# Patient Record
Sex: Female | Born: 1975 | Race: White | Hispanic: No | Marital: Married | Smoking: Former smoker
Health system: Southern US, Community
[De-identification: ages and names within clinical notes are randomized; demographics above are authoritative.]

## PROBLEM LIST (undated history)

## (undated) DIAGNOSIS — N200 Calculus of kidney: Secondary | ICD-10-CM

## (undated) HISTORY — PX: TUBAL LIGATION: SHX77

## (undated) HISTORY — PX: LITHOTRIPSY: SUR834

---

## 1999-06-23 ENCOUNTER — Other Ambulatory Visit: Admission: RE | Admit: 1999-06-23 | Discharge: 1999-06-23 | Payer: Self-pay | Admitting: *Deleted

## 2001-06-04 ENCOUNTER — Other Ambulatory Visit: Admission: RE | Admit: 2001-06-04 | Discharge: 2001-06-04 | Payer: Self-pay | Admitting: Gynecology

## 2002-01-03 ENCOUNTER — Inpatient Hospital Stay (HOSPITAL_COMMUNITY): Admission: AD | Admit: 2002-01-03 | Discharge: 2002-01-05 | Payer: Self-pay | Admitting: Gynecology

## 2002-01-03 ENCOUNTER — Encounter (INDEPENDENT_AMBULATORY_CARE_PROVIDER_SITE_OTHER): Payer: Self-pay | Admitting: *Deleted

## 2002-02-17 ENCOUNTER — Other Ambulatory Visit: Admission: RE | Admit: 2002-02-17 | Discharge: 2002-02-17 | Payer: Self-pay | Admitting: Gynecology

## 2002-08-22 ENCOUNTER — Ambulatory Visit (HOSPITAL_COMMUNITY): Admission: RE | Admit: 2002-08-22 | Discharge: 2002-08-22 | Payer: Self-pay | Admitting: Gynecology

## 2004-10-10 ENCOUNTER — Other Ambulatory Visit: Admission: RE | Admit: 2004-10-10 | Discharge: 2004-10-10 | Payer: Self-pay | Admitting: Family Medicine

## 2008-03-05 ENCOUNTER — Emergency Department (HOSPITAL_COMMUNITY): Admission: EM | Admit: 2008-03-05 | Discharge: 2008-03-05 | Payer: Self-pay | Admitting: Emergency Medicine

## 2008-12-14 ENCOUNTER — Other Ambulatory Visit: Admission: RE | Admit: 2008-12-14 | Discharge: 2008-12-14 | Payer: Self-pay | Admitting: Family Medicine

## 2009-08-29 ENCOUNTER — Emergency Department (HOSPITAL_COMMUNITY): Admission: EM | Admit: 2009-08-29 | Discharge: 2009-08-29 | Payer: Self-pay | Admitting: Emergency Medicine

## 2009-09-02 ENCOUNTER — Ambulatory Visit (HOSPITAL_COMMUNITY): Admission: RE | Admit: 2009-09-02 | Discharge: 2009-09-02 | Payer: Self-pay | Admitting: Urology

## 2010-01-13 ENCOUNTER — Ambulatory Visit: Payer: Self-pay | Admitting: Internal Medicine

## 2010-01-17 LAB — CBC WITH DIFFERENTIAL/PLATELET
BASO%: 0.4 % (ref 0.0–2.0)
Basophils Absolute: 0 10*3/uL (ref 0.0–0.1)
EOS%: 1 % (ref 0.0–7.0)
Eosinophils Absolute: 0.1 10*3/uL (ref 0.0–0.5)
LYMPH%: 25 % (ref 14.0–49.7)
MCH: 28.9 pg (ref 25.1–34.0)
MONO#: 0.4 10*3/uL (ref 0.1–0.9)
Platelets: 459 10*3/uL — ABNORMAL HIGH (ref 145–400)
lymph#: 2.4 10*3/uL (ref 0.9–3.3)

## 2010-01-17 LAB — IRON AND TIBC
%SAT: 5 % — ABNORMAL LOW (ref 20–55)
TIBC: 398 ug/dL (ref 250–470)
UIBC: 377 ug/dL

## 2010-01-17 LAB — COMPREHENSIVE METABOLIC PANEL
ALT: 9 U/L (ref 0–35)
Albumin: 4.4 g/dL (ref 3.5–5.2)
Calcium: 9.2 mg/dL (ref 8.4–10.5)
Total Protein: 7.2 g/dL (ref 6.0–8.3)

## 2010-01-17 LAB — FERRITIN: Ferritin: 10 ng/mL (ref 10–291)

## 2010-01-17 LAB — LACTATE DEHYDROGENASE: LDH: 109 U/L (ref 94–250)

## 2010-02-17 ENCOUNTER — Ambulatory Visit: Payer: Self-pay | Admitting: Internal Medicine

## 2010-04-01 ENCOUNTER — Encounter: Admission: RE | Admit: 2010-04-01 | Discharge: 2010-04-01 | Payer: Self-pay | Admitting: Internal Medicine

## 2010-10-16 ENCOUNTER — Emergency Department (HOSPITAL_COMMUNITY)
Admission: EM | Admit: 2010-10-16 | Discharge: 2010-10-16 | Payer: Self-pay | Source: Home / Self Care | Admitting: Emergency Medicine

## 2011-02-16 LAB — CBC
MCHC: 32.8 g/dL (ref 30.0–36.0)
Platelets: 524 10*3/uL — ABNORMAL HIGH (ref 150–400)
RBC: 4.67 MIL/uL (ref 3.87–5.11)
RDW: 15.3 % (ref 11.5–15.5)
WBC: 18.5 10*3/uL — ABNORMAL HIGH (ref 4.0–10.5)

## 2011-02-16 LAB — URINALYSIS, ROUTINE W REFLEX MICROSCOPIC
Bilirubin Urine: NEGATIVE
Glucose, UA: NEGATIVE mg/dL
Nitrite: NEGATIVE
Protein, ur: NEGATIVE mg/dL
Urobilinogen, UA: 0.2 mg/dL (ref 0.0–1.0)

## 2011-02-16 LAB — DIFFERENTIAL
Eosinophils Absolute: 0 10*3/uL (ref 0.0–0.7)
Eosinophils Relative: 0 % (ref 0–5)
Lymphs Abs: 1.6 10*3/uL (ref 0.7–4.0)
Monocytes Absolute: 0.5 10*3/uL (ref 0.1–1.0)

## 2011-02-16 LAB — URINE MICROSCOPIC-ADD ON

## 2011-02-16 LAB — PREGNANCY, URINE: Preg Test, Ur: NEGATIVE

## 2011-03-31 NOTE — Discharge Summary (Signed)
[facility]  Patient:    Cheryl Rivera, Cheryl Rivera Visit Number: [id number] MRN: [id number]          Service Type: OBS Location: [id number] [id number] Attending Physician:  Douglass Rivers Dictated by:   Antony Contras, [facility] Admit Date:  01/03/2002 Discharge Date: 01/05/2002                             Discharge Summary  DISCHARGE DIAGNOSES:          1. Intrauterine pregnancy at 40 weeks.                               2. Spontaneous onset of labor.  PROCEDURE:                    Normal spontaneous vaginal delivery of viable infant over intact perineum.  HISTORY OF PRESENT ILLNESS:   The patient is Rivera 35 year old, gravida 2, para 1-0-0-1, LMP Apr 08, 2001, [facility] January 02, 2002.  Prenatal course uncomplicated.  PRENATAL LABORATORY DATA:     Blood type Rivera positive, antibody screen negative. RPR, HBSAG, and HIV nonreactive, rubella immune.  MSAFP normal.  GBS was positive.  HOSPITAL COURSE:              The patient was admitted on January 03, 2002, with spontaneous onset of labor.  Cervix was 4 cm.  She progressed to complete dilatation, delivered an Apgars 8 and 20 female infant over intact perineum. Birth weight was 8 pounds 8 ounces.  She did experience some mild uterine atony immediately postpartum which was managed with massage and IM Methergine. Otherwise postpartum course was uncomplicated.  She was able to be discharged on her second postpartum day. CBC; hematocrit 35.5, hemoglobin 12, WBC 10, platelets 270.  DISPOSITION:                  Follow up in six weeks.  Continue prenatal vitamins and iron.  Motrin. Dictated by:   Antony Contras, [facility] Attending Physician:  Douglass Rivers DD:  01/20/02 TD:  01/21/02 Job: [id number]

## 2011-03-31 NOTE — Op Note (Signed)
NAME:  Cheryl Rivera, Cheryl Rivera                          ACCOUNT NO.:  [id number]   MEDICAL RECORD NO.:  [id number]                   PATIENT TYPE:  AMB   LOCATION:  SDC                                  FACILITY:  [facility]   PHYSICIAN:  Ivor Costa. Farrel Gobble, M.D.              DATE OF BIRTH:  [birth year]   DATE OF PROCEDURE:  08/22/2002  DATE OF DISCHARGE:                                 OPERATIVE REPORT   PREOPERATIVE DIAGNOSES:  Undesired fertility.   POSTOPERATIVE DIAGNOSES:  Undesired fertility.   PROCEDURE:  Scope bilateral tubal ligation with Falope rings.   SURGEON:  Ivor Costa. Farrel Gobble, M.D.   ANESTHESIA:  General.   ESTIMATED BLOOD LOSS:  None.   INDICATIONS:  This patient is Rivera 35 year old G2, P2 with undesired fertility.  Currently contracepting with birth control pills.   FINDINGS:  There were normal uterus, tubes, and ovaries.  There is Rivera left-  sided bowel adhesion which were left in place.   PATHOLOGY:  None.   COMPLICATIONS:  None.   PROCEDURE:  The patient was taken to the operating room.  General anesthesia  was induced.  Placed in the dorsal lithotomy position.  Prepped and draped  in usual sterile fashion.  Rivera bivalve speculum was placed in the vagina.  Cervix was visualized and uterine manipulator was placed.  Attention was  then turned to the abdomen.  Infraumbilical incision was made with Rivera scalpel  through which the Veress needle was inserted.  Free flow of fluid was noted.  Opening pressure was 9.  Rivera pneumoperitoneum was created until tympany was  appreciated above the liver.  Veress needle was then removed and Rivera number 10  disposable trocar was inserted through the infraumbilical port.  Placement  in the cavity was confirmed.  Findings were as above.  The bowel adhesions  made it difficult to visualize the tube on the left.  Therefore despite deep  Trendelenburg.  Therefore, Rivera suprapubic port was made under direct  visualization through which we could place the Falope  ring.  The right tube  was grasped, elevated, and Rivera Falope ring was placed.  On the left the bowel  was gently retracted from the area, although the adhesions were not cut, the  tube was able to be visualized out to the fimbriated end and in Rivera similar  fashion the Falope ring was placed.  The instruments were then removed under  direct visualization.  Both ports were injected with 0.25% Marcaine for Rivera  total of 10 cc.  Instruments were removed from the vagina and the cervix  noted to be hemostatic.  The fascia was closed with 0 Vicryl in both ports.  Skin was closed with 3-0 plain.  The patient tolerated procedure well.  Sponge, lap, and needle counts correct x2.  She was transferred to the PACU  in stable condition.  Ivor Costa. Farrel Gobble, M.D.    THL/MEDQ  D:  08/22/2002  T:  08/22/2002  Job:  [id number]

## 2011-08-08 LAB — URINALYSIS, ROUTINE W REFLEX MICROSCOPIC
Glucose, UA: NEGATIVE
Nitrite: NEGATIVE
Protein, ur: 30 — AB
Specific Gravity, Urine: 1.026
pH: 5.5

## 2011-08-08 LAB — PREGNANCY, URINE: Preg Test, Ur: NEGATIVE

## 2011-08-08 LAB — URINE MICROSCOPIC-ADD ON

## 2012-06-14 ENCOUNTER — Other Ambulatory Visit (HOSPITAL_COMMUNITY)
Admission: RE | Admit: 2012-06-14 | Discharge: 2012-06-14 | Disposition: A | Discharge status: Home / Self Care | Payer: 59 | Source: Ambulatory Visit | Attending: Family Medicine | Admitting: Family Medicine

## 2012-06-14 DIAGNOSIS — Z01419 Encounter for gynecological examination (general) (routine) without abnormal findings: Secondary | ICD-10-CM | POA: Insufficient documentation

## 2012-06-17 ENCOUNTER — Other Ambulatory Visit: Payer: Self-pay | Admitting: Family Medicine

## 2016-06-09 ENCOUNTER — Encounter (HOSPITAL_COMMUNITY): Payer: Self-pay

## 2016-06-09 ENCOUNTER — Emergency Department (HOSPITAL_COMMUNITY): Payer: BLUE CROSS/BLUE SHIELD

## 2016-06-09 ENCOUNTER — Inpatient Hospital Stay (HOSPITAL_COMMUNITY)
Admission: EM | Admit: 2016-06-09 | Discharge: 2016-06-12 | DRG: 872 | Disposition: A | Discharge status: Home / Self Care | Payer: BLUE CROSS/BLUE SHIELD | Attending: Internal Medicine | Admitting: Internal Medicine

## 2016-06-09 DIAGNOSIS — Z79899 Other long term (current) drug therapy: Secondary | ICD-10-CM

## 2016-06-09 DIAGNOSIS — N136 Pyonephrosis: Secondary | ICD-10-CM | POA: Diagnosis present

## 2016-06-09 DIAGNOSIS — N12 Tubulo-interstitial nephritis, not specified as acute or chronic: Secondary | ICD-10-CM | POA: Diagnosis present

## 2016-06-09 DIAGNOSIS — N179 Acute kidney failure, unspecified: Secondary | ICD-10-CM | POA: Diagnosis present

## 2016-06-09 DIAGNOSIS — F419 Anxiety disorder, unspecified: Secondary | ICD-10-CM | POA: Diagnosis present

## 2016-06-09 DIAGNOSIS — N200 Calculus of kidney: Secondary | ICD-10-CM

## 2016-06-09 DIAGNOSIS — Z87442 Personal history of urinary calculi: Secondary | ICD-10-CM

## 2016-06-09 DIAGNOSIS — R109 Unspecified abdominal pain: Secondary | ICD-10-CM | POA: Diagnosis not present

## 2016-06-09 DIAGNOSIS — N202 Calculus of kidney with calculus of ureter: Secondary | ICD-10-CM | POA: Diagnosis present

## 2016-06-09 DIAGNOSIS — Z87891 Personal history of nicotine dependence: Secondary | ICD-10-CM

## 2016-06-09 DIAGNOSIS — E876 Hypokalemia: Secondary | ICD-10-CM | POA: Diagnosis present

## 2016-06-09 DIAGNOSIS — R944 Abnormal results of kidney function studies: Secondary | ICD-10-CM | POA: Diagnosis present

## 2016-06-09 DIAGNOSIS — A419 Sepsis, unspecified organism: Principal | ICD-10-CM | POA: Diagnosis present

## 2016-06-09 DIAGNOSIS — N390 Urinary tract infection, site not specified: Secondary | ICD-10-CM

## 2016-06-09 DIAGNOSIS — T39395A Adverse effect of other nonsteroidal anti-inflammatory drugs [NSAID], initial encounter: Secondary | ICD-10-CM | POA: Diagnosis present

## 2016-06-09 HISTORY — DX: Calculus of kidney: N20.0

## 2016-06-09 LAB — URINALYSIS, ROUTINE W REFLEX MICROSCOPIC
BILIRUBIN URINE: NEGATIVE
Glucose, UA: NEGATIVE mg/dL
KETONES UR: NEGATIVE mg/dL
NITRITE: NEGATIVE
SPECIFIC GRAVITY, URINE: 1.026 (ref 1.005–1.030)
pH: 6 (ref 5.0–8.0)

## 2016-06-09 LAB — BASIC METABOLIC PANEL
Anion gap: 15 (ref 5–15)
BUN: 46 mg/dL — AB (ref 6–20)
CALCIUM: 8.7 mg/dL — AB (ref 8.9–10.3)
CO2: 27 mmol/L (ref 22–32)
CREATININE: 3.56 mg/dL — AB (ref 0.44–1.00)
Chloride: 92 mmol/L — ABNORMAL LOW (ref 101–111)
GFR calc non Af Amer: 15 mL/min — ABNORMAL LOW (ref >60)
GFR, EST AFRICAN AMERICAN: 17 mL/min — AB (ref >60)
Glucose, Bld: 110 mg/dL — ABNORMAL HIGH (ref 65–99)
Potassium: 2.7 mmol/L — CL (ref 3.5–5.1)
SODIUM: 134 mmol/L — AB (ref 135–145)

## 2016-06-09 LAB — CBC WITH DIFFERENTIAL/PLATELET
BASOS PCT: 0 %
Basophils Absolute: 0 10*3/uL (ref 0.0–0.1)
EOS ABS: 0 10*3/uL (ref 0.0–0.7)
EOS PCT: 0 %
HCT: 34.9 % — ABNORMAL LOW (ref 36.0–46.0)
Hemoglobin: 11.4 g/dL — ABNORMAL LOW (ref 12.0–15.0)
Lymphocytes Relative: 5 %
Lymphs Abs: 1.5 10*3/uL (ref 0.7–4.0)
MCH: 26.6 pg (ref 26.0–34.0)
MCHC: 32.7 g/dL (ref 30.0–36.0)
MCV: 81.5 fL (ref 78.0–100.0)
MONO ABS: 1.2 10*3/uL — AB (ref 0.1–1.0)
MONOS PCT: 5 %
NEUTROS PCT: 90 %
Neutro Abs: 24 10*3/uL — ABNORMAL HIGH (ref 1.7–7.7)
PLATELETS: 399 10*3/uL (ref 150–400)
RBC: 4.28 MIL/uL (ref 3.87–5.11)
RDW: 15.5 % (ref 11.5–15.5)
WBC: 26.7 10*3/uL — ABNORMAL HIGH (ref 4.0–10.5)

## 2016-06-09 LAB — URINE MICROSCOPIC-ADD ON

## 2016-06-09 LAB — I-STAT BETA HCG BLOOD, ED (MC, WL, AP ONLY)

## 2016-06-09 MED ORDER — MORPHINE SULFATE (PF) 10 MG/ML IV SOLN
10.0000 mg | Freq: Once | INTRAVENOUS | Status: DC
  Filled 2016-06-09: qty 1

## 2016-06-09 MED ORDER — SODIUM CHLORIDE 0.9 % IV BOLUS (SEPSIS)
1000.0000 mL | Freq: Once | INTRAVENOUS | Status: AC
  Administered 2016-06-10: 1000 mL via INTRAVENOUS

## 2016-06-09 MED ORDER — SODIUM CHLORIDE 0.9 % IV BOLUS (SEPSIS)
1000.0000 mL | Freq: Once | INTRAVENOUS | Status: AC
  Administered 2016-06-09: 1000 mL via INTRAVENOUS

## 2016-06-09 MED ORDER — MORPHINE SULFATE (PF) 4 MG/ML IV SOLN
4.0000 mg | INTRAVENOUS | Status: DC | PRN
  Administered 2016-06-09 – 2016-06-10 (×2): 4 mg via INTRAVENOUS
  Filled 2016-06-09 (×2): qty 1

## 2016-06-09 MED ORDER — SODIUM CHLORIDE 0.9 % IV SOLN
Freq: Once | INTRAVENOUS | Status: AC
  Administered 2016-06-09: 22:00:00 via INTRAVENOUS

## 2016-06-09 MED ORDER — DEXTROSE 5 % IV SOLN
1.0000 g | Freq: Once | INTRAVENOUS | Status: AC
  Administered 2016-06-09: 1 g via INTRAVENOUS
  Filled 2016-06-09: qty 10

## 2016-06-09 MED ORDER — ONDANSETRON HCL 4 MG/2ML IJ SOLN
4.0000 mg | Freq: Once | INTRAMUSCULAR | Status: AC
  Administered 2016-06-09: 4 mg via INTRAVENOUS
  Filled 2016-06-09: qty 2

## 2016-06-09 MED ORDER — KETOROLAC TROMETHAMINE 30 MG/ML IJ SOLN
30.0000 mg | Freq: Once | INTRAMUSCULAR | Status: AC
Start: 2016-06-09 — End: 2016-06-09
  Administered 2016-06-09: 30 mg via INTRAVENOUS
  Filled 2016-06-09: qty 1

## 2016-06-09 NOTE — ED Notes (Signed)
Unable to result POC urine due to having to much mucus in urine and the control line not showing up.

## 2016-06-09 NOTE — ED Triage Notes (Signed)
Pt has hx of 88mm kidney stone.  Pt does not know if it ever passed.  It was dx "2years ago".  Pt here with rt flank pain and fever 101 at home.  Pt denies blood in urine today but states there has been.  Burning with urination.  Pt also states generalized body aches.  Family member with hand/mouth disease.

## 2016-06-09 NOTE — ED Notes (Signed)
Back from ct.

## 2016-06-09 NOTE — ED Provider Notes (Addendum)
[facility]-EMERGENCY DEPT Provider Note   CSN: [id number] Arrival date & time: 06/09/16  1703  First Provider Contact:  1800 p.m.       History   Chief Complaint Chief Complaint  Patient presents with  . Flank Pain    HPI Cheryl Rivera is a 40 y.o. female.  She has a history of prior kidney stones. She states that in 2010 she had a lithotripsy. After that she remember seeing her urologist told her that she had a stone that she would "never passed". She does not know she has ever passed. Last 3 days she's had right flank pain. Yesterday she had fever and nausea. She is able to drink today without vomiting. Actually had no vomiting yesterday either. No shakes chills riders which continues a flank pain, dark urine, decreased urine.  HPI  Past Medical History:  Diagnosis Date  . Kidney stone     There are no active problems to display for this patient.   Past Surgical History:  Procedure Laterality Date  . LITHOTRIPSY    . TUBAL LIGATION      OB History    No data available       Home Medications    Prior to Admission medications   Medication Sig Start Date End Date Taking? Authorizing Provider  amphetamine-dextroamphetamine (ADDERALL) 30 MG tablet Take 30 mg by mouth 2 (two) times daily as needed. For focus. 05/18/16  Yes Historical Provider, MD  clonazePAM (KLONOPIN) 0.5 MG tablet Take 0.5 mg by mouth 2 (two) times daily as needed for anxiety. 05/30/16  Yes Historical Provider, MD  ibuprofen (ADVIL,MOTRIN) 200 MG tablet Take 800 mg by mouth every 6 (six) hours as needed (for pain.).   Yes Historical Provider, MD  Liniments (SALONPAS PAIN RELIEF PATCH EX) Place 1 application onto the skin daily as needed (for pain.).   Yes Historical Provider, MD  methocarbamol (ROBAXIN) 750 MG tablet Take 750 mg by mouth 3 (three) times daily as needed for spasms. 04/04/16  Yes Historical Provider, MD  naproxen sodium (ANAPROX) 220 MG tablet Take 660-880 mg by mouth 2 (two) times daily as  needed (for pain.).   Yes Historical Provider, MD    Family History History reviewed. No pertinent family history.  Social History Social History  Substance Use Topics  . Smoking status: Former Games developer  . Smokeless tobacco: Never Used  . Alcohol use No     Allergies   Review of patient's allergies indicates no known allergies.   Review of Systems Review of Systems  Constitutional: Positive for chills and fever. Negative for appetite change, diaphoresis and fatigue.  HENT: Negative for mouth sores, sore throat and trouble swallowing.   Eyes: Negative for visual disturbance.  Respiratory: Negative for cough, chest tightness, shortness of breath and wheezing.   Cardiovascular: Negative for chest pain.  Gastrointestinal: Positive for nausea. Negative for abdominal distention, abdominal pain, diarrhea and vomiting.  Endocrine: Negative for polydipsia, polyphagia and polyuria.  Genitourinary: Positive for decreased urine volume, flank pain, frequency, hematuria and urgency. Negative for dysuria.  Musculoskeletal: Negative for gait problem.  Skin: Negative for color change, pallor and rash.  Neurological: Negative for dizziness, syncope, light-headedness and headaches.  Hematological: Does not bruise/bleed easily.  Psychiatric/Behavioral: Negative for behavioral problems and confusion.     Physical Exam Updated Vital Signs BP 109/64 (BP Location: Right Arm)   Pulse 105   Temp 98 F (36.7 C) (Oral)   Resp 17   Ht  (1.651  m)   Wt 175 lb (79.4 kg)   LMP 06/05/2016   SpO2 96%   BMI 29.12 kg/m   Physical Exam  Constitutional: She is oriented to person, place, and time. She appears well-developed and well-nourished. No distress.  HENT:  Head: Normocephalic.  Eyes: Conjunctivae are normal. Pupils are equal, round, and reactive to light. No scleral icterus.  Neck: Normal range of motion. Neck supple. No thyromegaly present.  Cardiovascular: Normal rate and regular  rhythm.  Exam reveals no gallop and no friction rub.   No murmur heard. Pulmonary/Chest: Effort normal and breath sounds normal. No respiratory distress. She has no wheezes. She has no rales.  Abdominal: Soft. Bowel sounds are normal. She exhibits no distension. There is no tenderness. There is no rebound.    Musculoskeletal: Normal range of motion.  Neurological: She is alert and oriented to person, place, and time.  Skin: Skin is warm and dry. No rash noted.  Psychiatric: She has a normal mood and affect. Her behavior is normal.     ED Treatments / Results  Labs (all labs ordered are listed, but only abnormal results are displayed) Labs Reviewed  URINALYSIS, ROUTINE W REFLEX MICROSCOPIC (NOT AT [facility]) - Abnormal; Notable for the following:       Result Value   Color, Urine BROWN (*)    APPearance TURBID (*)    Hgb urine dipstick LARGE (*)    Protein, ur >300 (*)    Leukocytes, UA LARGE (*)    All other components within normal limits  CBC WITH DIFFERENTIAL/PLATELET - Abnormal; Notable for the following:    WBC 26.7 (*)    Hemoglobin 11.4 (*)    HCT 34.9 (*)    Neutro Abs 24.0 (*)    Monocytes Absolute 1.2 (*)    All other components within normal limits  BASIC METABOLIC PANEL - Abnormal; Notable for the following:    Sodium 134 (*)    Potassium 2.7 (*)    Chloride 92 (*)    Glucose, Bld 110 (*)    BUN 46 (*)    Creatinine, Ser 3.56 (*)    Calcium 8.7 (*)    GFR calc non Af Amer 15 (*)    GFR calc Af Amer 17 (*)    All other components within normal limits  URINE MICROSCOPIC-ADD ON - Abnormal; Notable for the following:    Squamous Epithelial / LPF 0-5 (*)    Bacteria, UA FEW (*)    All other components within normal limits  POC URINE PREG, ED  I-STAT BETA HCG BLOOD, ED ([facility], [facility], [facility] ONLY)    EKG  EKG Interpretation None       Radiology Ct Renal Stone Study  Result Date: 06/09/2016 CLINICAL DATA:  Pt has hx of 70mm kidney stone. Pt does not know if it ever  passed. It was dx "2years ago". Pt here with rt flank pain and fever 101 at home. Pt denies blood in urine today but states there has been. Burning with urination. Pt also states generalized body aches. EXAM: CT ABDOMEN AND PELVIS WITHOUT CONTRAST TECHNIQUE: Multidetector CT imaging of the abdomen and pelvis was performed following the standard protocol without IV contrast. COMPARISON:  04/01/2010 FINDINGS: Lung bases: Minor dependent subsegmental atelectasis. Otherwise clear. Heart normal size. Hepatobiliary: Fatty infiltration of the liver. No liver mass or focal lesion. Normal gallbladder. No bile duct dilation. Spleen, pancreas, adrenal glands:  Normal. Kidneys, ureters, bladder: There is moderate right hydronephrosis with swelling  of the right kidney caused by a 1 cm proximal ureteral stone. There is relative hypoattenuation along the posterior aspect of the mid to upper pole the right kidney. There are no other right ureteral stones. There are bilateral nonobstructing intrarenal stones. No discrete renal masses. No left hydronephrosis. Normal left ureter. Bladder is unremarkable. Uterus and adnexa are unremarkable. Lymph nodes:  No adenopathy. Gastrointestinal:  Normal.  Normal appendix visualized. Musculoskeletal: Mild disc degenerative change at L1-L2. No osteoblastic or osteolytic lesions. IMPRESSION: 1. 1 cm stone in the proximal right ureter causes moderate right hydronephrosis. In addition, there is relative low attenuation along the posterior aspect of the mid to upper pole right kidney. This raises the possibility of pyelonephritis. 2. No other acute findings. 3. Bilateral nonobstructing intrarenal stones. 4. Hepatic steatosis. Electronically Signed   By: Amie Portland M.D.   On: 06/09/2016 22:40   Procedures Procedures (including critical care time)  Medications Ordered in ED Medications  morphine 4 MG/ML injection 4 mg (4 mg Intravenous Given 06/09/16 2206)  sodium chloride 0.9 % bolus 1,000  mL (not administered)  ondansetron (ZOFRAN) injection 4 mg (4 mg Intravenous Given 06/09/16 2155)  cefTRIAXone (ROCEPHIN) 1 g in dextrose 5 % 50 mL IVPB (0 g Intravenous Stopped 06/09/16 2255)  sodium chloride 0.9 % bolus 1,000 mL (1,000 mLs Intravenous New Bag/Given 06/09/16 2154)  0.9 %  sodium chloride infusion ( Intravenous New Bag/Given 06/09/16 2154)  ketorolac (TORADOL) 30 MG/ML injection 30 mg (30 mg Intravenous Given 06/09/16 2156)     Initial Impression / Assessment and Plan / ED Course  I have reviewed the triage vital signs and the nursing notes.  Pertinent labs & imaging results that were available during my care of the patient were reviewed by me and considered in my medical decision making (see chart for details).  Clinical Course  Value Comment By Time  CT RENAL STONE STUDY (Reviewed) Rolland Porter, MD 07/28 2331  CT RENAL STONE STUDY (Reviewed) Rolland Porter, MD 07/28 2331    Patient given IV pain medication. This was given one dose of IV Toradol by myself. After her kidney function is noted there was careful to avoid additional nephrotoxic drugs and was given aggressive IV fluids. In review of the patient she states that she has been taking ibuprofen that before and sometimes as often as every 4 hours for her pain and has been doing so for "quite some time".  Her CT scan shows a large right ureteral stone and findings consistent with probable pyelonephritis. Urinalysis shows blood and infection.  Final Clinical Impressions(s) / ED Diagnoses   Final diagnoses:  Nephrolithiasis  UTI (lower urinary tract infection)  Pyelonephritis   Patient's symptoms are well-controlled. I placed a call to hospitalist regarding admission as well as urologist for consultation. She's been given IV Rocephin and culture is pending.   New Prescriptions New Prescriptions   No medications on file     Rolland Porter, MD 06/09/16 1610    Rolland Porter, MD 06/10/16 [phone]

## 2016-06-09 NOTE — ED Notes (Signed)
Lab informed this RN of critical potassium 2.7, RN and provider notified.

## 2016-06-10 ENCOUNTER — Encounter (HOSPITAL_COMMUNITY): Payer: Self-pay | Admitting: Registered Nurse

## 2016-06-10 ENCOUNTER — Inpatient Hospital Stay (HOSPITAL_COMMUNITY): Payer: BLUE CROSS/BLUE SHIELD | Admitting: Anesthesiology

## 2016-06-10 ENCOUNTER — Encounter (HOSPITAL_COMMUNITY)
Admission: EM | Disposition: A | Discharge status: Home / Self Care | Payer: Self-pay | Source: Home / Self Care | Attending: Internal Medicine

## 2016-06-10 DIAGNOSIS — N12 Tubulo-interstitial nephritis, not specified as acute or chronic: Secondary | ICD-10-CM | POA: Diagnosis not present

## 2016-06-10 DIAGNOSIS — N179 Acute kidney failure, unspecified: Secondary | ICD-10-CM | POA: Diagnosis present

## 2016-06-10 DIAGNOSIS — R109 Unspecified abdominal pain: Secondary | ICD-10-CM | POA: Diagnosis present

## 2016-06-10 DIAGNOSIS — F419 Anxiety disorder, unspecified: Secondary | ICD-10-CM | POA: Diagnosis present

## 2016-06-10 DIAGNOSIS — E876 Hypokalemia: Secondary | ICD-10-CM | POA: Diagnosis present

## 2016-06-10 DIAGNOSIS — Z79899 Other long term (current) drug therapy: Secondary | ICD-10-CM | POA: Diagnosis not present

## 2016-06-10 DIAGNOSIS — F411 Generalized anxiety disorder: Secondary | ICD-10-CM | POA: Diagnosis not present

## 2016-06-10 DIAGNOSIS — Z87442 Personal history of urinary calculi: Secondary | ICD-10-CM | POA: Diagnosis not present

## 2016-06-10 DIAGNOSIS — N202 Calculus of kidney with calculus of ureter: Secondary | ICD-10-CM | POA: Diagnosis present

## 2016-06-10 DIAGNOSIS — A419 Sepsis, unspecified organism: Secondary | ICD-10-CM | POA: Diagnosis present

## 2016-06-10 DIAGNOSIS — R944 Abnormal results of kidney function studies: Secondary | ICD-10-CM | POA: Diagnosis present

## 2016-06-10 DIAGNOSIS — T39395A Adverse effect of other nonsteroidal anti-inflammatory drugs [NSAID], initial encounter: Secondary | ICD-10-CM | POA: Diagnosis present

## 2016-06-10 DIAGNOSIS — N136 Pyonephrosis: Secondary | ICD-10-CM | POA: Diagnosis present

## 2016-06-10 DIAGNOSIS — Z87891 Personal history of nicotine dependence: Secondary | ICD-10-CM | POA: Diagnosis not present

## 2016-06-10 DIAGNOSIS — N200 Calculus of kidney: Secondary | ICD-10-CM | POA: Diagnosis not present

## 2016-06-10 HISTORY — PX: CYSTOSCOPY WITH RETROGRADE PYELOGRAM, URETEROSCOPY AND STENT PLACEMENT: SHX5789

## 2016-06-10 LAB — BASIC METABOLIC PANEL
ANION GAP: 8 (ref 5–15)
Anion gap: 10 (ref 5–15)
BUN: 48 mg/dL — ABNORMAL HIGH (ref 6–20)
BUN: 45 mg/dL — ABNORMAL HIGH (ref 6–20)
CALCIUM: 7.4 mg/dL — AB (ref 8.9–10.3)
CALCIUM: 7.5 mg/dL — AB (ref 8.9–10.3)
CHLORIDE: 105 mmol/L (ref 101–111)
CO2: 25 mmol/L (ref 22–32)
CO2: 24 mmol/L (ref 22–32)
CREATININE: 3.81 mg/dL — AB (ref 0.44–1.00)
Chloride: 100 mmol/L — ABNORMAL LOW (ref 101–111)
Creatinine, Ser: 2.95 mg/dL — ABNORMAL HIGH (ref 0.44–1.00)
GFR calc Af Amer: 16 mL/min — ABNORMAL LOW (ref >60)
GFR calc Af Amer: 22 mL/min — ABNORMAL LOW (ref >60)
GFR calc non Af Amer: 14 mL/min — ABNORMAL LOW (ref >60)
GFR calc non Af Amer: 19 mL/min — ABNORMAL LOW (ref >60)
GLUCOSE: 106 mg/dL — AB (ref 65–99)
GLUCOSE: 100 mg/dL — AB (ref 65–99)
Potassium: 2.8 mmol/L — ABNORMAL LOW (ref 3.5–5.1)
Potassium: 3 mmol/L — ABNORMAL LOW (ref 3.5–5.1)
Sodium: 135 mmol/L (ref 135–145)
Sodium: 137 mmol/L (ref 135–145)

## 2016-06-10 LAB — LACTIC ACID, PLASMA: LACTIC ACID, VENOUS: 0.7 mmol/L (ref 0.5–1.9)

## 2016-06-10 LAB — CBC
HCT: 27.3 % — ABNORMAL LOW (ref 36.0–46.0)
HEMOGLOBIN: 9 g/dL — AB (ref 12.0–15.0)
MCH: 26.9 pg (ref 26.0–34.0)
MCHC: 33 g/dL (ref 30.0–36.0)
MCV: 81.5 fL (ref 78.0–100.0)
PLATELETS: 302 10*3/uL (ref 150–400)
RBC: 3.35 MIL/uL — ABNORMAL LOW (ref 3.87–5.11)
RDW: 15.8 % — ABNORMAL HIGH (ref 11.5–15.5)
WBC: 16.2 10*3/uL — ABNORMAL HIGH (ref 4.0–10.5)

## 2016-06-10 LAB — SURGICAL PCR SCREEN
MRSA, PCR: NEGATIVE
STAPHYLOCOCCUS AUREUS: NEGATIVE

## 2016-06-10 LAB — SODIUM, URINE, RANDOM: Sodium, Ur: 92 mmol/L

## 2016-06-10 LAB — CREATININE, URINE, RANDOM: CREATININE, URINE: 55.39 mg/dL

## 2016-06-10 LAB — MAGNESIUM: Magnesium: 1.8 mg/dL (ref 1.7–2.4)

## 2016-06-10 SURGERY — CYSTOURETEROSCOPY, WITH RETROGRADE PYELOGRAM AND STENT INSERTION
Anesthesia: General | Site: Ureter | Laterality: Right

## 2016-06-10 MED ORDER — FENTANYL CITRATE (PF) 250 MCG/5ML IJ SOLN
INTRAMUSCULAR | Status: AC
  Filled 2016-06-10: qty 5

## 2016-06-10 MED ORDER — DEXAMETHASONE SODIUM PHOSPHATE 10 MG/ML IJ SOLN
INTRAMUSCULAR | Status: AC
Start: 2016-06-10 — End: 2016-06-10
  Filled 2016-06-10: qty 1

## 2016-06-10 MED ORDER — LACTATED RINGERS IV SOLN
INTRAVENOUS | Status: DC | PRN
  Administered 2016-06-10: 10:00:00 via INTRAVENOUS

## 2016-06-10 MED ORDER — PROPOFOL 10 MG/ML IV BOLUS
INTRAVENOUS | Status: AC
  Filled 2016-06-10: qty 20

## 2016-06-10 MED ORDER — ONDANSETRON HCL 4 MG/2ML IJ SOLN
INTRAMUSCULAR | Status: DC | PRN
  Administered 2016-06-10: 4 mg via INTRAVENOUS

## 2016-06-10 MED ORDER — LACTATED RINGERS IV SOLN: INTRAVENOUS | Status: DC

## 2016-06-10 MED ORDER — 0.9 % SODIUM CHLORIDE (POUR BTL) OPTIME
TOPICAL | Status: DC | PRN
  Administered 2016-06-10: 1000 mL

## 2016-06-10 MED ORDER — ONDANSETRON HCL 4 MG PO TABS: 4.0000 mg | ORAL_TABLET | Freq: Four times a day (QID) | ORAL | Status: DC | PRN

## 2016-06-10 MED ORDER — LIDOCAINE 2% (20 MG/ML) 5 ML SYRINGE
INTRAMUSCULAR | Status: DC | PRN
  Administered 2016-06-10: 100 mg via INTRAVENOUS

## 2016-06-10 MED ORDER — POTASSIUM CHLORIDE 10 MEQ/50ML IV SOLN: 10.0000 meq | Freq: Once | INTRAVENOUS | Status: DC

## 2016-06-10 MED ORDER — POTASSIUM CHLORIDE 20 MEQ PO PACK
40.0000 meq | PACK | Freq: Once | ORAL | Status: DC
  Filled 2016-06-10: qty 2

## 2016-06-10 MED ORDER — DEXTROSE 5 % IV SOLN
INTRAVENOUS | Status: AC
  Filled 2016-06-10: qty 2

## 2016-06-10 MED ORDER — HYDROMORPHONE HCL 1 MG/ML IJ SOLN
1.0000 mg | INTRAMUSCULAR | Status: AC | PRN
  Administered 2016-06-10 – 2016-06-11 (×3): 1 mg via INTRAVENOUS
  Filled 2016-06-10 (×3): qty 1

## 2016-06-10 MED ORDER — ACETAMINOPHEN 325 MG PO TABS
650.0000 mg | ORAL_TABLET | ORAL | Status: DC | PRN
  Administered 2016-06-10: 650 mg via ORAL
  Filled 2016-06-10: qty 2

## 2016-06-10 MED ORDER — PHENYLEPHRINE HCL 10 MG/ML IJ SOLN
INTRAMUSCULAR | Status: DC | PRN
  Administered 2016-06-10 (×3): 80 ug via INTRAVENOUS
  Administered 2016-06-10: 40 ug via INTRAVENOUS

## 2016-06-10 MED ORDER — LIDOCAINE HCL (CARDIAC) 20 MG/ML IV SOLN
INTRAVENOUS | Status: AC
  Filled 2016-06-10: qty 5

## 2016-06-10 MED ORDER — ROCURONIUM BROMIDE 100 MG/10ML IV SOLN
INTRAVENOUS | Status: AC
  Filled 2016-06-10: qty 1

## 2016-06-10 MED ORDER — PROPOFOL 10 MG/ML IV BOLUS
INTRAVENOUS | Status: DC | PRN
  Administered 2016-06-10: 180 mg via INTRAVENOUS

## 2016-06-10 MED ORDER — ONDANSETRON HCL 4 MG/2ML IJ SOLN
INTRAMUSCULAR | Status: AC
  Filled 2016-06-10: qty 2

## 2016-06-10 MED ORDER — FENTANYL CITRATE (PF) 100 MCG/2ML IJ SOLN: 25.0000 ug | INTRAMUSCULAR | Status: DC | PRN

## 2016-06-10 MED ORDER — FAMOTIDINE IN NACL 20-0.9 MG/50ML-% IV SOLN
20.0000 mg | Freq: Two times a day (BID) | INTRAVENOUS | Status: DC
  Administered 2016-06-10: 20 mg via INTRAVENOUS
  Filled 2016-06-10: qty 50

## 2016-06-10 MED ORDER — SODIUM CHLORIDE 0.9 % IR SOLN
Status: DC | PRN
  Administered 2016-06-10: 1000 mL
  Administered 2016-06-10: 3000 mL

## 2016-06-10 MED ORDER — SODIUM CHLORIDE 0.9 % IV BOLUS (SEPSIS): 500.0000 mL | Freq: Once | INTRAVENOUS | Status: DC

## 2016-06-10 MED ORDER — MIDAZOLAM HCL 5 MG/5ML IJ SOLN
INTRAMUSCULAR | Status: DC | PRN
  Administered 2016-06-10: 2 mg via INTRAVENOUS

## 2016-06-10 MED ORDER — FENTANYL CITRATE (PF) 100 MCG/2ML IJ SOLN
INTRAMUSCULAR | Status: DC | PRN
  Administered 2016-06-10 (×2): 50 ug via INTRAVENOUS

## 2016-06-10 MED ORDER — MORPHINE SULFATE (PF) 2 MG/ML IV SOLN
2.0000 mg | INTRAVENOUS | Status: DC | PRN
  Administered 2016-06-10 – 2016-06-11 (×7): 2 mg via INTRAVENOUS
  Filled 2016-06-10 (×8): qty 1

## 2016-06-10 MED ORDER — DEXTROSE-NACL 5-0.9 % IV SOLN
INTRAVENOUS | Status: DC
  Administered 2016-06-10 (×2): via INTRAVENOUS

## 2016-06-10 MED ORDER — SODIUM CHLORIDE 0.9 % IV SOLN
Freq: Once | INTRAVENOUS | Status: AC
  Administered 2016-06-10: 12:00:00 via INTRAVENOUS

## 2016-06-10 MED ORDER — ONDANSETRON HCL 4 MG/2ML IJ SOLN: 4.0000 mg | Freq: Four times a day (QID) | INTRAMUSCULAR | Status: DC | PRN

## 2016-06-10 MED ORDER — DEXAMETHASONE SODIUM PHOSPHATE 10 MG/ML IJ SOLN
INTRAMUSCULAR | Status: AC
  Filled 2016-06-10: qty 1

## 2016-06-10 MED ORDER — ENOXAPARIN SODIUM 30 MG/0.3ML ~~LOC~~ SOLN
30.0000 mg | SUBCUTANEOUS | Status: DC
  Administered 2016-06-11: 30 mg via SUBCUTANEOUS
  Filled 2016-06-10: qty 0.3

## 2016-06-10 MED ORDER — IOHEXOL 300 MG/ML  SOLN
INTRAMUSCULAR | Status: DC | PRN
  Administered 2016-06-10: 20 mL

## 2016-06-10 MED ORDER — CLONAZEPAM 0.5 MG PO TABS: 0.5000 mg | ORAL_TABLET | Freq: Two times a day (BID) | ORAL | Status: DC | PRN

## 2016-06-10 MED ORDER — MEPERIDINE HCL 50 MG/ML IJ SOLN: 6.2500 mg | INTRAMUSCULAR | Status: DC | PRN

## 2016-06-10 MED ORDER — PROMETHAZINE HCL 25 MG/ML IJ SOLN: 6.2500 mg | INTRAMUSCULAR | Status: DC | PRN

## 2016-06-10 MED ORDER — POTASSIUM CHLORIDE CRYS ER 20 MEQ PO TBCR
40.0000 meq | EXTENDED_RELEASE_TABLET | Freq: Once | ORAL | Status: AC
  Administered 2016-06-10: 40 meq via ORAL
  Filled 2016-06-10: qty 2

## 2016-06-10 MED ORDER — SODIUM CHLORIDE 0.9 % IV BOLUS (SEPSIS)
500.0000 mL | Freq: Once | INTRAVENOUS | Status: AC
  Administered 2016-06-10: 500 mL via INTRAVENOUS

## 2016-06-10 MED ORDER — POTASSIUM CHLORIDE 20 MEQ/15ML (10%) PO SOLN
40.0000 meq | Freq: Once | ORAL | Status: AC
  Administered 2016-06-10: 40 meq via ORAL
  Filled 2016-06-10: qty 30

## 2016-06-10 MED ORDER — POTASSIUM CHLORIDE 10 MEQ/100ML IV SOLN
10.0000 meq | Freq: Once | INTRAVENOUS | Status: DC
  Filled 2016-06-10: qty 100

## 2016-06-10 MED ORDER — SODIUM CHLORIDE 0.9% FLUSH
3.0000 mL | Freq: Two times a day (BID) | INTRAVENOUS | Status: DC
  Administered 2016-06-10 (×2): 3 mL via INTRAVENOUS

## 2016-06-10 MED ORDER — MIDAZOLAM HCL 2 MG/2ML IJ SOLN
INTRAMUSCULAR | Status: AC
  Filled 2016-06-10: qty 4

## 2016-06-10 MED ORDER — FAMOTIDINE IN NACL 20-0.9 MG/50ML-% IV SOLN
20.0000 mg | INTRAVENOUS | Status: DC
  Administered 2016-06-11: 20 mg via INTRAVENOUS
  Filled 2016-06-10: qty 50

## 2016-06-10 MED ORDER — DEXTROSE 5 % IV SOLN
1.0000 g | INTRAVENOUS | Status: DC
  Administered 2016-06-10 – 2016-06-11 (×2): 1 g via INTRAVENOUS
  Filled 2016-06-10 (×3): qty 10

## 2016-06-10 SURGICAL SUPPLY — 19 items
BAG URO CATCHER STRL LF (MISCELLANEOUS) ×3 IMPLANT
BASKET ZERO TIP NITINOL 2.4FR (BASKET) IMPLANT
CATH INTERMIT  6FR 70CM (CATHETERS) ×3 IMPLANT
CLOTH BEACON ORANGE TIMEOUT ST (SAFETY) ×3 IMPLANT
FIBER LASER FLEXIVA 365 (UROLOGICAL SUPPLIES) IMPLANT
FIBER LASER TRAC TIP (UROLOGICAL SUPPLIES) IMPLANT
GLOVE BIOGEL M STRL SZ7.5 (GLOVE) ×3 IMPLANT
GOWN STRL REUS W/TWL LRG LVL3 (GOWN DISPOSABLE) ×6 IMPLANT
GUIDEWIRE ANG ZIPWIRE 038X150 (WIRE) IMPLANT
GUIDEWIRE STR DUAL SENSOR (WIRE) ×3 IMPLANT
IV NS 1000ML (IV SOLUTION) ×2
IV NS 1000ML BAXH (IV SOLUTION) ×1 IMPLANT
MANIFOLD NEPTUNE II (INSTRUMENTS) ×3 IMPLANT
PACK CYSTO (CUSTOM PROCEDURE TRAY) ×3 IMPLANT
SHEATH ACCESS URETERAL 38CM (SHEATH) IMPLANT
STENT CONTOUR 6FRX26X.038 (STENTS) ×3 IMPLANT
STENT URET 6FRX24 CONTOUR (STENTS) IMPLANT
TUBING CONNECTING 10 (TUBING) ×2 IMPLANT
TUBING CONNECTING 10' (TUBING) ×1

## 2016-06-10 NOTE — Op Note (Addendum)
Preoperative diagnosis:  1. Right ureteral stone 2. UTI 3. ARF   Postoperative diagnosis:  1. Right ureteral stone 2. UTI 3. ARF   Procedure:  1. Cystoscopy 2. Right ureteral stent placement (6 x26 - no string)   Surgeon: Rolly Salter, Montez Hageman. M.D.  Resident: Dr. Lincoln Brigham  Anesthesia: General  Complications: None  EBL: Minimal  Specimens: None  Indication: Cheryl Rivera is a 40 y.o. patient with a right ureteral stone, fever, and acute renal failure. After reviewing the management options for treatment, he elected to proceed with the above surgical procedure(s). We have discussed the potential benefits and risks of the procedure, side effects of the proposed treatment, the likelihood of the patient achieving the goals of the procedure, and any potential problems that might occur during the procedure or recuperation. Informed consent has been obtained.  Description of procedure:  The patient was taken to the operating room and general anesthesia was induced.  The patient was placed in the dorsal lithotomy position, prepped and draped in the usual sterile fashion, and preoperative antibiotics were administered. A preoperative time-out was performed.   Cystourethroscopy was performed.  The patient's urethra was examined and was normal. The bladder was then systematically examined in its entirety. There was no evidence for any bladder tumors, stones, or other mucosal pathology.    Attention then turned to the right ureteral orifice and a ureteral catheter was used to intubate the ureteral orifice. A 0.38 sensor guidewire was then advanced up the right ureter into the renal pelvis under fluoroscopic guidance. An attempt was made to pass a 6 Fr ureteral catheter into the renal pelvis to obtain a culture but the ureteral catheter was not able to passed up by the stone easily and this attempt was abandoned.  The wire was then backloaded through the cystoscope and a ureteral stent was  advance over the wire using Seldinger technique.  The stent was positioned appropriately under fluoroscopic and cystoscopic guidance. An initial attempt to place a 6 x 24 stent appeared to be too short and so a 6 x 26 stent was placed.  The wire was then removed with an adequate stent curl noted in the renal pelvis as well as in the bladder.  The bladder was then emptied and the procedure ended.  The patient appeared to tolerate the procedure well and without complications.  The patient was able to be awakened and transferred to the recovery unit in satisfactory condition.    [name] MD

## 2016-06-10 NOTE — Progress Notes (Signed)
MD notified and bolus ordered of 500cc  Of n/s

## 2016-06-10 NOTE — Transfer of Care (Signed)
Immediate Anesthesia Transfer of Care Note  Patient: Cheryl Rivera  Procedure(s) Performed: Procedure(s): CYSTOSCOPY WITH RETROGRADE PYELOGRAM, AND RIGHT URETERAL STENT PLACEMENT (Right)  Patient Location: PACU  Anesthesia Type:General  Level of Consciousness:  sedated, patient cooperative and responds to stimulation  Airway & Oxygen Therapy:Patient Spontanous Breathing and Patient connected to face mask oxgen  Post-op Assessment:  Report given to PACU RN and Post -op Vital signs reviewed and stable  Post vital signs:  Reviewed and stable  Last Vitals:  Vitals:   06/10/16 1055 06/10/16 1056  BP: (!) 106/58 (!) 102/59  Pulse: (!) 110   Resp: 18 19  Temp: 37.4 C     Complications: No apparent anesthesia complications

## 2016-06-10 NOTE — Consult Note (Signed)
Urology Consult   Physician requesting consult: Dr. Rolland Porter  Reason for consult: right ureteral stone  History of Present Illness: Cheryl Rivera is a 40 y.o. female with history of kidney stones who presented with right flank pain tonight. She has a longstanding known right sided stoned which she was initially supposed to have ESWL for but did not due to cost. She has had pain for about three days. Endorses fever and nausea, with measured temperature of 101F yesterday. Denies any emesis. Endorses dysuria and prior hematuria. Her creatinine is 3.5 from baseline of 0.7 in 2001 and has been taking greater that recommended dosing of ibuprofen. She did have a leukocytosis of 26K in the ED with few bacteria, large LE and negative nitrite on UA. She did previously have a shock wave lithotripsy performed in 2010.  She denies a history of recurrent UTIs.  Past Medical History:  Diagnosis Date  . Kidney stone     Past Surgical History:  Procedure Laterality Date  . LITHOTRIPSY    . TUBAL LIGATION       Current Hospital Medications:  Home meds:    Medication List    ASK your doctor about these medications   amphetamine-dextroamphetamine 30 MG tablet Commonly known as:  ADDERALL Take 30 mg by mouth 2 (two) times daily as needed. For focus.   clonazePAM 0.5 MG tablet Commonly known as:  KLONOPIN Take 0.5 mg by mouth 2 (two) times daily as needed for anxiety.   ibuprofen 200 MG tablet Commonly known as:  ADVIL,MOTRIN Take 800 mg by mouth every 6 (six) hours as needed (for pain.).   methocarbamol 750 MG tablet Commonly known as:  ROBAXIN Take 750 mg by mouth 3 (three) times daily as needed for spasms.   naproxen sodium 220 MG tablet Commonly known as:  ANAPROX Take 660-880 mg by mouth 2 (two) times daily as needed (for pain.).   SALONPAS PAIN RELIEF PATCH EX Place 1 application onto the skin daily as needed (for pain.).       Scheduled Meds:  Continuous Infusions: . sodium  chloride     PRN Meds:.morphine injection  Allergies: No Known Allergies  History reviewed. No pertinent family history.  Social History:  reports that she has quit smoking. She has never used smokeless tobacco. She reports that she does not drink alcohol or use drugs.  ROS: A complete review of systems was performed.  All systems are negative except for pertinent findings as noted.  Physical Exam:  Vital signs in last 24 hours: Temp:  [98 F (36.7 C)] 98 F (36.7 C) (07/28 1706) Pulse Rate:  [105-120] 105 (07/28 2233) Resp:  [17-20] 17 (07/28 2233) BP: (109-129)/(64-83) 109/64 (07/28 2233) SpO2:  [96 %-100 %] 96 % (07/28 2233) Weight:  [79.4 kg (175 lb)] 79.4 kg (175 lb) (07/28 2011) Constitutional:  Alert and oriented, No acute distress Cardiovascular: normal peripheral perfusion Respiratory: Normal respiratory effort  GI: Abdomen is soft, tender in RLQ, nondistended, no abdominal masses GU: Right CVA tenderness Neurologic: Grossly intact, no focal deficits Psychiatric: Normal mood and affect  Laboratory Data:   Recent Labs  06/09/16 2002  WBC 26.7*  HGB 11.4*  HCT 34.9*  PLT 399     Recent Labs  06/09/16 2002  NA 134*  K 2.7*  CL 92*  GLUCOSE 110*  BUN 46*  CALCIUM 8.7*  CREATININE 3.56*     Results for orders placed or performed during the hospital encounter of 06/09/16 (from  the past 24 hour(s))  Urinalysis, Routine w reflex microscopic- may I&O cath if menses     Status: Abnormal   Collection Time: 06/09/16  5:17 PM  Result Value Ref Range   Color, Urine BROWN (A) YELLOW   APPearance TURBID (A) CLEAR   Specific Gravity, Urine 1.026 1.005 - 1.030   pH 6.0 5.0 - 8.0   Glucose, UA NEGATIVE NEGATIVE mg/dL   Hgb urine dipstick LARGE (A) NEGATIVE   Bilirubin Urine NEGATIVE NEGATIVE   Ketones, ur NEGATIVE NEGATIVE mg/dL   Protein, ur >409 (A) NEGATIVE mg/dL   Nitrite NEGATIVE NEGATIVE   Leukocytes, UA LARGE (A) NEGATIVE  Urine microscopic-add on      Status: Abnormal   Collection Time: 06/09/16  5:17 PM  Result Value Ref Range   Squamous Epithelial / LPF 0-5 (A) NONE SEEN   WBC, UA TOO NUMEROUS TO COUNT 0 - 5 WBC/hpf   RBC / HPF 6-30 0 - 5 RBC/hpf   Bacteria, UA FEW (A) NONE SEEN   Urine-Other MICROSCOPIC EXAM PERFORMED ON UNCONCENTRATED URINE   CBC with Differential     Status: Abnormal   Collection Time: 06/09/16  8:02 PM  Result Value Ref Range   WBC 26.7 (H) 4.0 - 10.5 K/uL   RBC 4.28 3.87 - 5.11 MIL/uL   Hemoglobin 11.4 (L) 12.0 - 15.0 g/dL   HCT 81.1 (L) 91.4 - 78.2 %   MCV 81.5 78.0 - 100.0 fL   MCH 26.6 26.0 - 34.0 pg   MCHC 32.7 30.0 - 36.0 g/dL   RDW 95.6 21.3 - 08.6 %   Platelets 399 150 - 400 K/uL   Neutrophils Relative % 90 %   Neutro Abs 24.0 (H) 1.7 - 7.7 K/uL   Lymphocytes Relative 5 %   Lymphs Abs 1.5 0.7 - 4.0 K/uL   Monocytes Relative 5 %   Monocytes Absolute 1.2 (H) 0.1 - 1.0 K/uL   Eosinophils Relative 0 %   Eosinophils Absolute 0.0 0.0 - 0.7 K/uL   Basophils Relative 0 %   Basophils Absolute 0.0 0.0 - 0.1 K/uL  Basic metabolic panel     Status: Abnormal   Collection Time: 06/09/16  8:02 PM  Result Value Ref Range   Sodium 134 (L) 135 - 145 mmol/L   Potassium 2.7 (LL) 3.5 - 5.1 mmol/L   Chloride 92 (L) 101 - 111 mmol/L   CO2 27 22 - 32 mmol/L   Glucose, Bld 110 (H) 65 - 99 mg/dL   BUN 46 (H) 6 - 20 mg/dL   Creatinine, Ser 5.78 (H) 0.44 - 1.00 mg/dL   Calcium 8.7 (L) 8.9 - 10.3 mg/dL   GFR calc non Af Amer 15 (L) >60 mL/min   GFR calc Af Amer 17 (L) >60 mL/min   Anion gap 15 5 - 15  I-Stat Beta hCG blood, ED ([facility], [facility], [facility] only)     Status: None   Collection Time: 06/09/16  8:06 PM  Result Value Ref Range   I-stat hCG, quantitative <5.0 <5 mIU/mL   Comment 3           No results found for this or any previous visit (from the past 240 hour(s)).  Renal Function:  Recent Labs  06/09/16 2002  CREATININE 3.56*   Estimated Creatinine Clearance: 21.7 mL/min (by C-G formula based on SCr of  3.56 mg/dL).  Radiologic Imaging: Ct Renal Stone Study  Result Date: 06/09/2016 CLINICAL DATA:  Pt has hx of 7mm kidney  stone. Pt does not know if it ever passed. It was dx "2years ago". Pt here with rt flank pain and fever 101 at home. Pt denies blood in urine today but states there has been. Burning with urination. Pt also states generalized body aches. EXAM: CT ABDOMEN AND PELVIS WITHOUT CONTRAST TECHNIQUE: Multidetector CT imaging of the abdomen and pelvis was performed following the standard protocol without IV contrast. COMPARISON:  04/01/2010 FINDINGS: Lung bases: Minor dependent subsegmental atelectasis. Otherwise clear. Heart normal size. Hepatobiliary: Fatty infiltration of the liver. No liver mass or focal lesion. Normal gallbladder. No bile duct dilation. Spleen, pancreas, adrenal glands:  Normal. Kidneys, ureters, bladder: There is moderate right hydronephrosis with swelling of the right kidney caused by a 1 cm proximal ureteral stone. There is relative hypoattenuation along the posterior aspect of the mid to upper pole the right kidney. There are no other right ureteral stones. There are bilateral nonobstructing intrarenal stones. No discrete renal masses. No left hydronephrosis. Normal left ureter. Bladder is unremarkable. Uterus and adnexa are unremarkable. Lymph nodes:  No adenopathy. Gastrointestinal:  Normal.  Normal appendix visualized. Musculoskeletal: Mild disc degenerative change at L1-L2. No osteoblastic or osteolytic lesions. IMPRESSION: 1. 1 cm stone in the proximal right ureter causes moderate right hydronephrosis. In addition, there is relative low attenuation along the posterior aspect of the mid to upper pole right kidney. This raises the possibility of pyelonephritis. 2. No other acute findings. 3. Bilateral nonobstructing intrarenal stones. 4. Hepatic steatosis. Electronically Signed   By: Amie Portland M.D.   On: 06/09/2016 22:40   I independently reviewed the above imaging  studies.  Impression/Recommendation: 40 year old female with 8mm x 1cm proximal right ureteral stone and moderate hydronephrosis in addition to likely UTI and leukocytosis. She would benefit from decompression of her right kidney. We discussed the options of medical expulsive therapy, ureteral stent placement and nephrostomy tube placement. We recommend right ureteral stent placement and she agrees with this. The risks and benefits of the procedure were discussed. She elects to proceed with right ureteral stent placement.  - Please send urine culture to evaluate for UTI - Please keep NPO - Will proceed with cystoscopy, right retrograde pyelogram, right ureteral stent placement later this AM - Suspect a large contributor to her elevated creatinine is NSAID use as she has a normal contralateral kidney  Kendell Bane A Sukhu 06/10/2016, 12:04 AM

## 2016-06-10 NOTE — Progress Notes (Signed)
Patient admitted after midnight, please see H&P.  Cultures pending, IV abx given-- for stent today.  Will get lactic acid and bolus IVF and restart at higher hourly rate-- if BP does not improve, may need SDU until stabilized.  Marlin Canary DO

## 2016-06-10 NOTE — Progress Notes (Signed)
Patient ID: Cheryl Rivera, female   DOB: 1976-07-30, 40 y.o.   MRN: [id number]   Day of Surgery Subjective: Reports improved right flank pain and nausea. Has tolerated small amount of food. Feels tired but better. BP improved this afternoon.  Objective: Vital signs in last 24 hours: Temp:  [98 F (36.7 C)-99.8 F (37.7 C)] 99.8 F (37.7 C) (07/29 1100) Pulse Rate:  [94-120] 102 (07/29 1300) Resp:  [16-25] 25 (07/29 1300) BP: (86-129)/(51-83) 117/65 (07/29 1200) SpO2:  [96 %-100 %] 99 % (07/29 1300) Weight:  [79.4 kg (175 lb)-83.8 kg (184 lb 12.8 oz)] 83.8 kg (184 lb 12.8 oz) (07/29 0256)  Intake/Output from previous day: No intake/output data recorded. Intake/Output this shift: Total I/O In: 800 [I.V.:800] Out: 400 [Urine:400]  Physical Exam:  General: Alert and oriented Abdomen: Soft, ND, Right CVA tenderness with tenderness of RUQ   Lab Results:  Recent Labs  06/09/16 2002 06/10/16 0517  HGB 11.4* 9.0*  HCT 34.9* 27.3*   CBC Latest Ref Rng & Units 06/10/2016 06/09/2016 01/17/2010  WBC 4.0 - 10.5 K/uL 16.2(H) 26.7(H) 9.4  Hemoglobin 12.0 - 15.0 g/dL 9.0(L) 11.4(L) 12.5  Hematocrit 36.0 - 46.0 % 27.3(L) 34.9(L) 37.7  Platelets 150 - 400 K/uL 302 399 459(H)     BMET  Recent Labs  06/09/16 2002 06/10/16 0517  NA 134* 135  K 2.7* 2.8*  CL 92* 100*  CO2 27 25  GLUCOSE 110* 106*  BUN 46* 48*  CREATININE 3.56* 3.81*  CALCIUM 8.7* 7.4*     Studies/Results: Ct Renal Stone Study  Result Date: 06/09/2016 CLINICAL DATA:  Pt has hx of 7mm kidney stone. Pt does not know if it ever passed. It was dx "2years ago". Pt here with rt flank pain and fever 101 at home. Pt denies blood in urine today but states there has been. Burning with urination. Pt also states generalized body aches. EXAM: CT ABDOMEN AND PELVIS WITHOUT CONTRAST TECHNIQUE: Multidetector CT imaging of the abdomen and pelvis was performed following the standard protocol without IV contrast. COMPARISON:   04/01/2010 FINDINGS: Lung bases: Minor dependent subsegmental atelectasis. Otherwise clear. Heart normal size. Hepatobiliary: Fatty infiltration of the liver. No liver mass or focal lesion. Normal gallbladder. No bile duct dilation. Spleen, pancreas, adrenal glands:  Normal. Kidneys, ureters, bladder: There is moderate right hydronephrosis with swelling of the right kidney caused by a 1 cm proximal ureteral stone. There is relative hypoattenuation along the posterior aspect of the mid to upper pole the right kidney. There are no other right ureteral stones. There are bilateral nonobstructing intrarenal stones. No discrete renal masses. No left hydronephrosis. Normal left ureter. Bladder is unremarkable. Uterus and adnexa are unremarkable. Lymph nodes:  No adenopathy. Gastrointestinal:  Normal.  Normal appendix visualized. Musculoskeletal: Mild disc degenerative change at L1-L2. No osteoblastic or osteolytic lesions. IMPRESSION: 1. 1 cm stone in the proximal right ureter causes moderate right hydronephrosis. In addition, there is relative low attenuation along the posterior aspect of the mid to upper pole right kidney. This raises the possibility of pyelonephritis. 2. No other acute findings. 3. Bilateral nonobstructing intrarenal stones. 4. Hepatic steatosis. Electronically Signed   By: Amie Portland M.D.   On: 06/09/2016 22:40   Assessment/Plan: Right ureteral stone with UTI and ARF s/p right stent with improved symptoms and improving vitals - Continue broad spectrum antibiotics pending cultures - Continue to trend creatinine -  May require nephrology consult if not improving after stent placement.   LOS:  0 days   Carolin Coy 06/10/2016, 3:14 PM

## 2016-06-10 NOTE — Progress Notes (Signed)
Patient ID: Cheryl Rivera, female   DOB: 1976-08-26, 40 y.o.   MRN: [id number]   Day of Surgery Subjective: Pt still with right flank pain.  BP low this morning.    Objective: Vital signs in last 24 hours: Temp:  [98 F (36.7 C)-99.3 F (37.4 C)] 99.3 F (37.4 C) (07/29 0827) Pulse Rate:  [95-120] 103 (07/29 0827) Resp:  [16-25] 16 (07/29 0455) BP: (86-129)/(51-83) 86/55 (07/29 0827) SpO2:  [96 %-100 %] 97 % (07/29 0827) Weight:  [79.4 kg (175 lb)-83.8 kg (184 lb 12.8 oz)] 83.8 kg (184 lb 12.8 oz) (07/29 0256)  Intake/Output from previous day: No intake/output data recorded. Intake/Output this shift: No intake/output data recorded.  Physical Exam:  General: Alert and oriented Abdomen: Soft, ND, Right CVA tenderness with tenderness of RUQ   Lab Results:  Recent Labs  06/09/16 2002 06/10/16 0517  HGB 11.4* 9.0*  HCT 34.9* 27.3*   CBC Latest Ref Rng & Units 06/10/2016 06/09/2016 01/17/2010  WBC 4.0 - 10.5 K/uL 16.2(H) 26.7(H) 9.4  Hemoglobin 12.0 - 15.0 g/dL 9.0(L) 11.4(L) 12.5  Hematocrit 36.0 - 46.0 % 27.3(L) 34.9(L) 37.7  Platelets 150 - 400 K/uL 302 399 459(H)     BMET  Recent Labs  06/09/16 2002 06/10/16 0517  NA 134* 135  K 2.7* 2.8*  CL 92* 100*  CO2 27 25  GLUCOSE 110* 106*  BUN 46* 48*  CREATININE 3.56* 3.81*  CALCIUM 8.7* 7.4*     Studies/Results: Ct Renal Stone Study  Result Date: 06/09/2016 CLINICAL DATA:  Pt has hx of 58mm kidney stone. Pt does not know if it ever passed. It was dx "2years ago". Pt here with rt flank pain and fever 101 at home. Pt denies blood in urine today but states there has been. Burning with urination. Pt also states generalized body aches. EXAM: CT ABDOMEN AND PELVIS WITHOUT CONTRAST TECHNIQUE: Multidetector CT imaging of the abdomen and pelvis was performed following the standard protocol without IV contrast. COMPARISON:  04/01/2010 FINDINGS: Lung bases: Minor dependent subsegmental atelectasis. Otherwise clear. Heart  normal size. Hepatobiliary: Fatty infiltration of the liver. No liver mass or focal lesion. Normal gallbladder. No bile duct dilation. Spleen, pancreas, adrenal glands:  Normal. Kidneys, ureters, bladder: There is moderate right hydronephrosis with swelling of the right kidney caused by a 1 cm proximal ureteral stone. There is relative hypoattenuation along the posterior aspect of the mid to upper pole the right kidney. There are no other right ureteral stones. There are bilateral nonobstructing intrarenal stones. No discrete renal masses. No left hydronephrosis. Normal left ureter. Bladder is unremarkable. Uterus and adnexa are unremarkable. Lymph nodes:  No adenopathy. Gastrointestinal:  Normal.  Normal appendix visualized. Musculoskeletal: Mild disc degenerative change at L1-L2. No osteoblastic or osteolytic lesions. IMPRESSION: 1. 1 cm stone in the proximal right ureter causes moderate right hydronephrosis. In addition, there is relative low attenuation along the posterior aspect of the mid to upper pole right kidney. This raises the possibility of pyelonephritis. 2. No other acute findings. 3. Bilateral nonobstructing intrarenal stones. 4. Hepatic steatosis. Electronically Signed   By: Amie Portland M.D.   On: 06/09/2016 22:40   Assessment/Plan: Right ureteral stone with UTI and ARF - Plan for cysto and right stent placement - Continue broad spectrum antibiotics pending cultures - Saline bolus this morning.  If patient continues to demonstrate hemodynamic instability, may require transfer to step down ICU - Cause of ARF likely multifactorial.  Would not expect this with unilateral  obstruction.  Pt with excessive NSAID use at home.  May require nephrology consult if not improving after stent placement.   LOS: 0 days   Cheryl Rivera,LES 06/10/2016, 8:32 AM

## 2016-06-10 NOTE — Anesthesia Procedure Notes (Signed)
Procedure Name: LMA Insertion Date/Time: 06/10/2016 10:19 AM Performed by: Kym Groom L Pre-anesthesia Checklist: Patient identified, Emergency Drugs available, Suction available, Patient being monitored and Timeout performed Patient Re-evaluated:Patient Re-evaluated prior to inductionOxygen Delivery Method: Circle system utilized Preoxygenation: Pre-oxygenation with 100% oxygen Intubation Type: IV induction Ventilation: Mask ventilation without difficulty LMA: LMA inserted LMA Size: 4.0 Number of attempts: 1 Tube secured with: Tape Dental Injury: Teeth and Oropharynx as per pre-operative assessment

## 2016-06-10 NOTE — H&P (Signed)
History and Physical  Cheryl Rivera ZOX:[id number] DOB: [birth year] DOA: 06/09/2016  PCP:  No primary care provider on file.   Chief Complaint:  Flank pain / fever  History of Present Illness:  - Patient is a 40 yo female with history of anxiety and prior renal stones who came with cc of right flank pain. - The pain started 2-3 days ago, radiating sometimes to pubic area, associated with dysuria and urgency/frequency and hemauria occasionally. She also had a fever. No N/V/D/C but she has not eaten anything today due to pain. No chest pain/dyspnea/cough. She had mild dizziness that has resolved.  - She had renal stones diagnosed in 2011 and was supposed to get lithotripsy but she did not have insurance so she kept postponing the procedure despite having periodic symptoms.  - She has been using NSAIDs for pain as needed up to 20-3 times daily.   Review of Systems:  CONSTITUTIONAL:     No night sweats.  No fatigue.  +fever. No chills. Eyes:                            No visual changes.  No eye pain.  No eye discharge.   ENT:                              No epistaxis.  No sinus pain.  No sore throat.   No congestion. RESPIRATORY:           No cough.  No wheeze.  No hemoptysis.  No dyspnea CARDIOVASCULAR   :  No chest pains.  No palpitations. GASTROINTESTINAL:  No abdominal pain.  No nausea. No vomiting.  No diarrhea. No vconstipation.  No hematemesis.  No hematochezia.  No melena. GENITOURINARY:      +urgency.  +frequency.  +dysuria.  +hematuria.  + obstructive symptoms.  No discharge.  No pain.  MUSCULOSKELETAL:  No musculoskeletal pain.  No joint swelling.  No arthritis. NEUROLOGICAL:        No confusion.  No weakness. No headache. No seizure. PSYCHIATRIC:             No depression. No anxiety. No suicidal ideation. SKIN:                             No rashes.  No lesions.  No wounds. ENDOCRINE:                No weight loss.  No polydipsia.  No polyuria.  No  polyphagia. HEMATOLOGIC:           No purpura.  No petechiae.  No bleeding.  ALLERGIC                 : No pruritus.  No angioedema Other:  Past Medical and Surgical History:   Past Medical History:  Diagnosis Date  . Kidney stone    Past Surgical History:  Procedure Laterality Date  . LITHOTRIPSY    . TUBAL LIGATION      Social History:   reports that she has quit smoking. She has never used smokeless tobacco. She reports that she does not drink alcohol or use drugs.    No Known Allergies  History reviewed. No pertinent family history.    Prior to Admission medications   Medication Sig Start Date End Date Taking? Authorizing Provider  amphetamine-dextroamphetamine (ADDERALL) 30 MG tablet Take 30 mg by mouth 2 (two) times daily as needed. For focus. 05/18/16  Yes Historical Provider, MD  clonazePAM (KLONOPIN) 0.5 MG tablet Take 0.5 mg by mouth 2 (two) times daily as needed for anxiety. 05/30/16  Yes Historical Provider, MD  ibuprofen (ADVIL,MOTRIN) 200 MG tablet Take 800 mg by mouth every 6 (six) hours as needed (for pain.).   Yes Historical Provider, MD  Liniments (SALONPAS PAIN RELIEF PATCH EX) Place 1 application onto the skin daily as needed (for pain.).   Yes Historical Provider, MD  methocarbamol (ROBAXIN) 750 MG tablet Take 750 mg by mouth 3 (three) times daily as needed for spasms. 04/04/16  Yes Historical Provider, MD  naproxen sodium (ANAPROX) 220 MG tablet Take 660-880 mg by mouth 2 (two) times daily as needed (for pain.).   Yes Historical Provider, MD    Physical Exam: BP 109/64 (BP Location: Right Arm)   Pulse 105   Temp 98 F (36.7 C) (Oral)   Resp 17   Ht 5\' 5"  (1.651 m)   Wt 79.4 kg (175 lb)   LMP 06/05/2016   SpO2 96%   BMI 29.12 kg/m   GENERAL :   Alert and cooperative, and appears to be in no acute distress. HEAD:           normocephalic. EYES:            PERRL, EOMI.  EARS:           hearing grossly intact. NOSE:           No nasal  discharge. THROAT:     Oral cavity and pharynx normal.   NECK:          supple, non-tender. CARDIAC:    Normal S1 and S2. No gallop. No murmurs.  Vascular:     no peripheral edema. LUNGS:       Clear to auscultation  ABDOMEN: Positive bowel sounds. Soft, nondistended, RLQ tenderness/right posterior tenderness. No guarding or rebound.      MSK:           No joint erythema or tenderness. Normal muscular development. EXT           : No significant deformity or joint abnormality. Neuro        : Alert, oriented to person, place, and time.                     Marland Kitchen SKIN:            No rash. No lesions. PSYCH:       No hallucination. Patient is not suicidal.          Labs on Admission:  Reviewed.   Radiological Exams on Admission: Ct Renal Stone Study  Result Date: 06/09/2016 CLINICAL DATA:  Pt has hx of 7mm kidney stone. Pt does not know if it ever passed. It was dx "2years ago". Pt here with rt flank pain and fever 101 at home. Pt denies blood in urine today but states there has been. Burning with urination. Pt also states generalized body aches. EXAM: CT ABDOMEN AND PELVIS WITHOUT CONTRAST TECHNIQUE: Multidetector CT imaging of the abdomen and pelvis was performed following the standard protocol without IV contrast. COMPARISON:  04/01/2010 FINDINGS: Lung bases: Minor dependent subsegmental atelectasis. Otherwise clear. Heart normal size. Hepatobiliary: Fatty infiltration of the liver. No liver mass or focal lesion. Normal gallbladder. No bile duct dilation. Spleen, pancreas, adrenal glands:  Normal.  Kidneys, ureters, bladder: There is moderate right hydronephrosis with swelling of the right kidney caused by a 1 cm proximal ureteral stone. There is relative hypoattenuation along the posterior aspect of the mid to upper pole the right kidney. There are no other right ureteral stones. There are bilateral nonobstructing intrarenal stones. No discrete renal masses. No left hydronephrosis. Normal left  ureter. Bladder is unremarkable. Uterus and adnexa are unremarkable. Lymph nodes:  No adenopathy. Gastrointestinal:  Normal.  Normal appendix visualized. Musculoskeletal: Mild disc degenerative change at L1-L2. No osteoblastic or osteolytic lesions. IMPRESSION: 1. 1 cm stone in the proximal right ureter causes moderate right hydronephrosis. In addition, there is relative low attenuation along the posterior aspect of the mid to upper pole right kidney. This raises the possibility of pyelonephritis. 2. No other acute findings. 3. Bilateral nonobstructing intrarenal stones. 4. Hepatic steatosis. Electronically Signed   By: Amie Portland M.D.   On: 06/09/2016 22:40     Assessment/Plan  Sepsis due to pyelonephritis:  Ucx and Bcx sent.  Started on Ceftriaxone Likely related to renal stones found in CT scan Continue IVF  Mod right hydronephrosis:  Due to right renal stone Urology consulted, likely will get a stent in am, will keep NPO Morphine prn pain   AKI:  Multifactorial: sepsis, NSAIDs, hydronephrosis Continue IVF Monitor Cr  Hypokalemia:  Will replace with KCL 40 meq PO  Anxiety: cont home meds.   Input & Output: NA Lines & Tubes: PIV DVT prophylaxis:  [place] enoxaparin  GI prophylaxis: Pepcid  Consultants: Urology Code Status: Full Family Communication: At bedside  Disposition Plan: Admit/ tele     Eston Esters M.D [facility]

## 2016-06-10 NOTE — Anesthesia Postprocedure Evaluation (Signed)
Anesthesia Post Note  Patient: Cheryl Rivera  Procedure(s) Performed: Procedure(s) (LRB): CYSTOSCOPY WITH RETROGRADE PYELOGRAM, AND RIGHT URETERAL STENT PLACEMENT (Right)  Patient location during evaluation: PACU Anesthesia Type: General Level of consciousness: awake and alert Pain management: pain level controlled Vital Signs Assessment: post-procedure vital signs reviewed and stable Respiratory status: spontaneous breathing, nonlabored ventilation, respiratory function stable and patient connected to nasal cannula oxygen Cardiovascular status: blood pressure returned to baseline and stable Postop Assessment: no signs of nausea or vomiting Anesthetic complications: no    Last Vitals:  Vitals:   06/10/16 1058 06/10/16 1100  BP: 105/71 (!) 107/53  Pulse:    Resp: (!) 25 19  Temp:  37.7 C    Last Pain:  Vitals:   06/10/16 0827  TempSrc: Oral  PainSc:                  Sebastian Ache

## 2016-06-10 NOTE — Progress Notes (Signed)
PHARMACY NOTE -  Ceftriaxone  Pharmacy has been requested to assist with dosing of Ceftriaxone for UTI. Dosage remains stable at 1gm IV q24h and need for further dosage adjustment appears unlikely at present.  (No adjustment necessary for renal impairment)  Will sign off at this time.  Please reconsult if a change in clinical status warrants re-evaluation of dosage.  Terrilee Files, PharmD. 06/10/16 @ 00:37

## 2016-06-10 NOTE — Anesthesia Preprocedure Evaluation (Addendum)
Anesthesia Evaluation  Patient identified by MRN, date of birth, ID band Patient awake    Reviewed: Allergy & Precautions, NPO status , Patient's Chart, lab work & pertinent test results  Airway Mallampati: III   Neck ROM: Full    Dental  (+) Teeth Intact, Dental Advisory Given   Pulmonary neg pulmonary ROS, former smoker,    breath sounds clear to auscultation       Cardiovascular negative cardio ROS   Rhythm:Regular     Neuro/Psych Anxiety negative neurological ROS  negative psych ROS   GI/Hepatic negative GI ROS, Neg liver ROS,   Endo/Other  negative endocrine ROS  Renal/GU negative Renal ROSRight sided stone  negative genitourinary   Musculoskeletal negative musculoskeletal ROS (+)   Abdominal   Peds negative pediatric ROS (+)  Hematology negative hematology ROS (+) anemia , 9/27   Anesthesia Other Findings   Reproductive/Obstetrics negative OB ROS SP BTL                            Anesthesia Physical Anesthesia Plan  ASA: II  Anesthesia Plan: General   Post-op Pain Management:    Induction: Intravenous  Airway Management Planned: LMA and Oral ETT  Additional Equipment:   Intra-op Plan:   Post-operative Plan: Extubation in OR  Informed Consent: I have reviewed the patients History and Physical, chart, labs and discussed the procedure including the risks, benefits and alternatives for the proposed anesthesia with the patient or authorized representative who has indicated his/her understanding and acceptance.     Plan Discussed with:   Anesthesia Plan Comments: (Check am Potasium, was 2.7, has been receiving oral potasium   Potasium 2.8 this am)       Anesthesia Quick Evaluation

## 2016-06-11 DIAGNOSIS — F411 Generalized anxiety disorder: Secondary | ICD-10-CM

## 2016-06-11 DIAGNOSIS — A419 Sepsis, unspecified organism: Principal | ICD-10-CM

## 2016-06-11 LAB — CBC
HEMATOCRIT: 26.8 % — AB (ref 36.0–46.0)
Hemoglobin: 8.3 g/dL — ABNORMAL LOW (ref 12.0–15.0)
MCH: 25.8 pg — AB (ref 26.0–34.0)
MCHC: 31 g/dL (ref 30.0–36.0)
MCV: 83.2 fL (ref 78.0–100.0)
Platelets: 324 10*3/uL (ref 150–400)
RBC: 3.22 MIL/uL — ABNORMAL LOW (ref 3.87–5.11)
RDW: 15.9 % — AB (ref 11.5–15.5)
WBC: 8.1 10*3/uL (ref 4.0–10.5)

## 2016-06-11 LAB — BASIC METABOLIC PANEL
Anion gap: 6 (ref 5–15)
BUN: 31 mg/dL — AB (ref 6–20)
CALCIUM: 7.6 mg/dL — AB (ref 8.9–10.3)
CO2: 23 mmol/L (ref 22–32)
CREATININE: 1.82 mg/dL — AB (ref 0.44–1.00)
Chloride: 109 mmol/L (ref 101–111)
GFR calc non Af Amer: 33 mL/min — ABNORMAL LOW (ref >60)
GFR, EST AFRICAN AMERICAN: 39 mL/min — AB (ref >60)
Glucose, Bld: 91 mg/dL (ref 65–99)
Potassium: 3.5 mmol/L (ref 3.5–5.1)
SODIUM: 138 mmol/L (ref 135–145)

## 2016-06-11 LAB — URINE CULTURE

## 2016-06-11 MED ORDER — POTASSIUM CHLORIDE CRYS ER 20 MEQ PO TBCR
40.0000 meq | EXTENDED_RELEASE_TABLET | Freq: Once | ORAL | Status: AC
  Administered 2016-06-11: 40 meq via ORAL
  Filled 2016-06-11: qty 2

## 2016-06-11 MED ORDER — HYDROCODONE-ACETAMINOPHEN 5-325 MG PO TABS
1.0000 | ORAL_TABLET | ORAL | Status: DC | PRN
  Administered 2016-06-11 – 2016-06-12 (×3): 1 via ORAL
  Filled 2016-06-11: qty 2
  Filled 2016-06-11 (×2): qty 1

## 2016-06-11 NOTE — Progress Notes (Signed)
Patient ID: Cheryl Rivera, female   DOB: [birth year], 40 y.o.   MRN: [id number]   1 Day Post-Op Subjective: Improved right abdominal pain, still has some right back pain. Vitals stable. WBC down to 8 and creatinine decreased to 1.8 with good UOP.  Objective: Vital signs in last 24 hours: Temp:  [98 F (36.7 C)-101 F (38.3 C)] 98 F (36.7 C) (07/30 0557) Pulse Rate:  [89-110] 89 (07/30 0557) Resp:  [18-29] 18 (07/30 0557) BP: (86-123)/(53-84) 114/67 (07/30 0557) SpO2:  [92 %-100 %] 99 % (07/30 0557) Weight:  [86.3 kg (190 lb 4.8 oz)] 86.3 kg (190 lb 4.8 oz) (07/29 2059)  Intake/Output from previous day: 07/29 0701 - 07/30 0700 In: 2450 [I.V.:800; IV Piggyback:600] Out: 650 [Urine:650] Intake/Output this shift: No intake/output data recorded.  Physical Exam:  General: Alert and oriented Abdomen: Soft, ND, Right CVA tenderness    Lab Results:  Recent Labs  06/09/16 2002 06/10/16 0517 06/11/16 0504  HGB 11.4* 9.0* 8.3*  HCT 34.9* 27.3* 26.8*   CBC Latest Ref Rng & Units 06/11/2016 06/10/2016 06/09/2016  WBC 4.0 - 10.5 K/uL 8.1 16.2(H) 26.7(H)  Hemoglobin 12.0 - 15.0 g/dL 8.3(L) 9.0(L) 11.4(L)  Hematocrit 36.0 - 46.0 % 26.8(L) 27.3(L) 34.9(L)  Platelets 150 - 400 K/uL 324 302 399     BMET  Recent Labs  06/10/16 1209 06/11/16 0504  NA 137 138  K 3.0* 3.5  CL 105 109  CO2 24 23  GLUCOSE 100* 91  BUN 45* 31*  CREATININE 2.95* 1.82*  CALCIUM 7.5* 7.6*     Studies/Results: Ct Renal Stone Study  Result Date: 06/09/2016 CLINICAL DATA:  Pt has hx of 7mm kidney stone. Pt does not know if it ever passed. It was dx "2years ago". Pt here with rt flank pain and fever 101 at home. Pt denies blood in urine today but states there has been. Burning with urination. Pt also states generalized body aches. EXAM: CT ABDOMEN AND PELVIS WITHOUT CONTRAST TECHNIQUE: Multidetector CT imaging of the abdomen and pelvis was performed following the standard protocol without IV contrast.  COMPARISON:  04/01/2010 FINDINGS: Lung bases: Minor dependent subsegmental atelectasis. Otherwise clear. Heart normal size. Hepatobiliary: Fatty infiltration of the liver. No liver mass or focal lesion. Normal gallbladder. No bile duct dilation. Spleen, pancreas, adrenal glands:  Normal. Kidneys, ureters, bladder: There is moderate right hydronephrosis with swelling of the right kidney caused by a 1 cm proximal ureteral stone. There is relative hypoattenuation along the posterior aspect of the mid to upper pole the right kidney. There are no other right ureteral stones. There are bilateral nonobstructing intrarenal stones. No discrete renal masses. No left hydronephrosis. Normal left ureter. Bladder is unremarkable. Uterus and adnexa are unremarkable. Lymph nodes:  No adenopathy. Gastrointestinal:  Normal.  Normal appendix visualized. Musculoskeletal: Mild disc degenerative change at L1-L2. No osteoblastic or osteolytic lesions. IMPRESSION: 1. 1 cm stone in the proximal right ureter causes moderate right hydronephrosis. In addition, there is relative low attenuation along the posterior aspect of the mid to upper pole right kidney. This raises the possibility of pyelonephritis. 2. No other acute findings. 3. Bilateral nonobstructing intrarenal stones. 4. Hepatic steatosis. Electronically Signed   By: Amie Portland M.D.   On: 06/09/2016 22:40   Assessment/Plan: Right ureteral stone with pyelonephritis and ARF s/p right stent now improving   - Continue broad spectrum antibiotics pending cultures - Continue to trend creatinine - We will arrange outpatient follow up with Dr. Annabell Howells for  discussion of treatment options for her ureteral stone   LOS: 1 day   Carolin Coy 06/11/2016, 7:55 AM

## 2016-06-11 NOTE — Progress Notes (Signed)
PROGRESS NOTE    Cheryl Rivera  WUJ:[id number] DOB: [birth year] DOA: 06/09/2016 PCP: No primary care provider on file.   Outpatient Specialists:     Brief Narrative:  History of Present Illness:  - Patient is a 39 yo female with history of anxiety and prior renal stones who came with cc of right flank pain. - The pain started 2-3 days ago, radiating sometimes to pubic area, associated with dysuria and urgency/frequency and hemauria occasionally. She also had a fever. No N/V/D/C but she has not eaten anything today due to pain. No chest pain/dyspnea/cough. She had mild dizziness that has resolved.  - She had renal stones diagnosed in 2011 and was supposed to get lithotripsy but she did not have insurance so she kept postponing the procedure despite having periodic symptoms.  - She has been using NSAIDs for pain as needed up to 20-3 times daily.    Assessment & Plan:   Active Problems:   Pyelonephritis   Sepsis due to pyelonephritis:  Ucx and Bcx sent- length of treatment per urology (10-14 days?) Started on Ceftriaxone Likely related to renal stones found in CT scan resolved  Mod right hydronephrosis:  Due to right renal stone S/p stent- urology follow up outpatient  AKI:  Improving with IVF  Hypokalemia:  replaced  Anxiety: cont home meds.    DVT prophylaxis:  lovenox  Code Status: Full Code  Family Communication: none  Disposition Plan:  Home in AM if cr improved and cultures resulted   Consultants:   urology  Procedures:        Subjective: Feeling much better  Objective: Vitals:   06/10/16 1800 06/10/16 2059 06/10/16 2311 06/11/16 0557  BP: 122/65 106/84  114/67  Pulse: (!) 103 100  89  Resp: (!) 21 20  18   Temp:  (!) 101 F (38.3 C) 98.4 F (36.9 C) 98 F (36.7 C)  TempSrc:  Oral Oral Oral  SpO2: 98% 99%  99%  Weight:  86.3 kg (190 lb 4.8 oz)    Height:  5\' 5"  (1.651 m)      Intake/Output Summary (Last 24 hours) at 06/11/16  0941 Last data filed at 06/11/16 0700  Gross per 24 hour  Intake             2450 ml  Output              650 ml  Net             1800 ml   Filed Weights   06/09/16 2011 06/10/16 0256 06/10/16 2059  Weight: 79.4 kg (175 lb) 83.8 kg (184 lb 12.8 oz) 86.3 kg (190 lb 4.8 oz)    Examination:  General exam: Appears calm and comfortable  Respiratory system: Clear to auscultation. Respiratory effort normal. Cardiovascular system: S1 & S2 heard, RRR. No JVD, murmurs, rubs, gallops or clicks. No pedal edema. Gastrointestinal system: Abdomen is nondistended, soft and nontender. No organomegaly or masses felt. Normal bowel sounds heard.     Data Reviewed: I have personally reviewed following labs and imaging studies  CBC:  Recent Labs Lab 06/09/16 2002 06/10/16 0517 06/11/16 0504  WBC 26.7* 16.2* 8.1  NEUTROABS 24.0*  --   --   HGB 11.4* 9.0* 8.3*  HCT 34.9* 27.3* 26.8*  MCV 81.5 81.5 83.2  PLT 399 302 324   Basic Metabolic Panel:  Recent Labs Lab 06/09/16 2002 06/10/16 0517 06/10/16 1209 06/11/16 0504  NA 134* 135 137 138  K  2.7* 2.8* 3.0* 3.5  CL 92* 100* 105 109  CO2 27 25 24 23   GLUCOSE 110* 106* 100* 91  BUN 46* 48* 45* 31*  CREATININE 3.56* 3.81* 2.95* 1.82*  CALCIUM 8.7* 7.4* 7.5* 7.6*  MG  --   --  1.8  --    GFR: Estimated Creatinine Clearance: 44.1 mL/min (by C-G formula based on SCr of 1.82 mg/dL). Liver Function Tests: No results for input(s): AST, ALT, ALKPHOS, BILITOT, PROT, ALBUMIN in the last 168 hours. No results for input(s): LIPASE, AMYLASE in the last 168 hours. No results for input(s): AMMONIA in the last 168 hours. Coagulation Profile: No results for input(s): INR, PROTIME in the last 168 hours. Cardiac Enzymes: No results for input(s): CKTOTAL, CKMB, CKMBINDEX, TROPONINI in the last 168 hours. BNP (last 3 results) No results for input(s): PROBNP in the last 8760 hours. HbA1C: No results for input(s): HGBA1C in the last 72  hours. CBG: No results for input(s): GLUCAP in the last 168 hours. Lipid Profile: No results for input(s): CHOL, HDL, LDLCALC, TRIG, CHOLHDL, LDLDIRECT in the last 72 hours. Thyroid Function Tests: No results for input(s): TSH, T4TOTAL, FREET4, T3FREE, THYROIDAB in the last 72 hours. Anemia Panel: No results for input(s): VITAMINB12, FOLATE, FERRITIN, TIBC, IRON, RETICCTPCT in the last 72 hours. Urine analysis:    Component Value Date/Time   COLORURINE BROWN (A) 06/09/2016 1717   APPEARANCEUR TURBID (A) 06/09/2016 1717   LABSPEC 1.026 06/09/2016 1717   PHURINE 6.0 06/09/2016 1717   GLUCOSEU NEGATIVE 06/09/2016 1717   HGBUR LARGE (A) 06/09/2016 1717   BILIRUBINUR NEGATIVE 06/09/2016 1717   KETONESUR NEGATIVE 06/09/2016 1717   PROTEINUR >300 (A) 06/09/2016 1717   UROBILINOGEN 0.2 08/29/2009 1443   NITRITE NEGATIVE 06/09/2016 1717   LEUKOCYTESUR LARGE (A) 06/09/2016 1717     ) Recent Results (from the past 240 hour(s))  Surgical pcr screen     Status: None   Collection Time: 06/10/16  7:54 AM  Result Value Ref Range Status   MRSA, PCR NEGATIVE NEGATIVE Final   Staphylococcus aureus NEGATIVE NEGATIVE Final    Comment:        The Xpert SA Assay (FDA approved for NASAL specimens in patients over 91 years of age), is one component of a comprehensive surveillance program.  Test performance has been validated by [facility] for patients greater than or equal to 1 year old. It is not intended to diagnose infection nor to guide or monitor treatment.       Anti-infectives    Start     Dose/Rate Route Frequency Ordered Stop   06/10/16 2200  cefTRIAXone (ROCEPHIN) 1 g in dextrose 5 % 50 mL IVPB     1 g 100 mL/hr over 30 Minutes Intravenous Every 24 hours 06/10/16 0036     06/09/16 2100  cefTRIAXone (ROCEPHIN) 1 g in dextrose 5 % 50 mL IVPB     1 g 100 mL/hr over 30 Minutes Intravenous  Once 06/09/16 2051 06/09/16 2255       Radiology Studies: Ct Renal Stone  Study  Result Date: 06/09/2016 CLINICAL DATA:  Pt has hx of 79mm kidney stone. Pt does not know if it ever passed. It was dx "2years ago". Pt here with rt flank pain and fever 101 at home. Pt denies blood in urine today but states there has been. Burning with urination. Pt also states generalized body aches. EXAM: CT ABDOMEN AND PELVIS WITHOUT CONTRAST TECHNIQUE: Multidetector CT imaging of the abdomen  and pelvis was performed following the standard protocol without IV contrast. COMPARISON:  04/01/2010 FINDINGS: Lung bases: Minor dependent subsegmental atelectasis. Otherwise clear. Heart normal size. Hepatobiliary: Fatty infiltration of the liver. No liver mass or focal lesion. Normal gallbladder. No bile duct dilation. Spleen, pancreas, adrenal glands:  Normal. Kidneys, ureters, bladder: There is moderate right hydronephrosis with swelling of the right kidney caused by a 1 cm proximal ureteral stone. There is relative hypoattenuation along the posterior aspect of the mid to upper pole the right kidney. There are no other right ureteral stones. There are bilateral nonobstructing intrarenal stones. No discrete renal masses. No left hydronephrosis. Normal left ureter. Bladder is unremarkable. Uterus and adnexa are unremarkable. Lymph nodes:  No adenopathy. Gastrointestinal:  Normal.  Normal appendix visualized. Musculoskeletal: Mild disc degenerative change at L1-L2. No osteoblastic or osteolytic lesions. IMPRESSION: 1. 1 cm stone in the proximal right ureter causes moderate right hydronephrosis. In addition, there is relative low attenuation along the posterior aspect of the mid to upper pole right kidney. This raises the possibility of pyelonephritis. 2. No other acute findings. 3. Bilateral nonobstructing intrarenal stones. 4. Hepatic steatosis. Electronically Signed   By: Amie Portland M.D.   On: 06/09/2016 22:40       Scheduled Meds: . cefTRIAXone (ROCEPHIN)  IV  1 g Intravenous Q24H  . enoxaparin  (LOVENOX) injection  30 mg Subcutaneous Q24H  . famotidine (PEPCID) IV  20 mg Intravenous Q24H  . potassium chloride  10 mEq Intravenous Once  . sodium chloride  500 mL Intravenous Once  . sodium chloride flush  3 mL Intravenous Q12H   Continuous Infusions:    LOS: 1 day    Time spent: 25 min    Marjory Meints Juanetta Gosling, DO [facility] Pager [phone]  If 7PM-7AM, please contact night-coverage [email or web address] Password [email or web address] 06/11/2016, 9:41 AM

## 2016-06-12 ENCOUNTER — Encounter (HOSPITAL_COMMUNITY): Payer: Self-pay | Admitting: Urology

## 2016-06-12 DIAGNOSIS — N200 Calculus of kidney: Secondary | ICD-10-CM

## 2016-06-12 LAB — CBC
HCT: 26.9 % — ABNORMAL LOW (ref 36.0–46.0)
HEMOGLOBIN: 8.6 g/dL — AB (ref 12.0–15.0)
MCH: 26.6 pg (ref 26.0–34.0)
MCHC: 32 g/dL (ref 30.0–36.0)
MCV: 83.3 fL (ref 78.0–100.0)
PLATELETS: 366 10*3/uL (ref 150–400)
RBC: 3.23 MIL/uL — AB (ref 3.87–5.11)
RDW: 15.9 % — ABNORMAL HIGH (ref 11.5–15.5)
WBC: 6.7 10*3/uL (ref 4.0–10.5)

## 2016-06-12 LAB — BASIC METABOLIC PANEL
ANION GAP: 4 — AB (ref 5–15)
BUN: 23 mg/dL — AB (ref 6–20)
CHLORIDE: 113 mmol/L — AB (ref 101–111)
CO2: 23 mmol/L (ref 22–32)
Calcium: 8.7 mg/dL — ABNORMAL LOW (ref 8.9–10.3)
Creatinine, Ser: 1.07 mg/dL — ABNORMAL HIGH (ref 0.44–1.00)
GFR calc Af Amer: >60 mL/min (ref >60)
GLUCOSE: 92 mg/dL (ref 65–99)
POTASSIUM: 4.2 mmol/L (ref 3.5–5.1)
Sodium: 140 mmol/L (ref 135–145)

## 2016-06-12 MED ORDER — CEFUROXIME AXETIL 500 MG PO TABS: 500.0000 mg | ORAL_TABLET | Freq: Two times a day (BID) | ORAL | 0 refills | Status: DC

## 2016-06-12 MED ORDER — HYDROCODONE-ACETAMINOPHEN 5-325 MG PO TABS: 1.0000 | ORAL_TABLET | ORAL | 0 refills | Status: DC | PRN

## 2016-06-12 NOTE — Progress Notes (Signed)
Patient ID: Cheryl Rivera, female   DOB: 09/20/1976, 40 y.o.   MRN: [id number]   2 Days Post-Op   Subjective: Continues to feel better. Vitals stable. WBC down to 6.7 and creatinine decreased to 1.07 with good UOP. Urine culture grew mixed species, blood culture NGTD.   Objective: Vital signs in last 24 hours: Temp:  [98.3 F (36.8 C)-99.3 F (37.4 C)] 98.3 F (36.8 C) (07/31 0534) Pulse Rate:  [74-83] 83 (07/31 0534) Resp:  [18-20] 18 (07/31 0534) BP: (117-133)/(82-86) 130/85 (07/31 0534) SpO2:  [100 %] 100 % (07/31 0534)  Intake/Output from previous day: 07/30 0701 - 07/31 0700 In: 1130 [P.O.:1080; IV Piggyback:50] Out: -  Intake/Output this shift: Total I/O In: 170 [P.O.:120; IV Piggyback:50] Out: -   Physical Exam:  General: Alert and oriented Abdomen: Soft, ND, Right CVA tenderness present but improved   Lab Results:  Recent Labs  06/10/16 0517 06/11/16 0504 06/12/16 0448  HGB 9.0* 8.3* 8.6*  HCT 27.3* 26.8* 26.9*   CBC Latest Ref Rng & Units 06/12/2016 06/11/2016 06/10/2016  WBC 4.0 - 10.5 K/uL 6.7 8.1 16.2(H)  Hemoglobin 12.0 - 15.0 g/dL 8.7(G) 8.3(L) 9.0(L)  Hematocrit 36.0 - 46.0 % 26.9(L) 26.8(L) 27.3(L)  Platelets 150 - 400 K/uL 366 324 302     BMET  Recent Labs  06/11/16 0504 06/12/16 0448  NA 138 140  K 3.5 4.2  CL 109 113*  CO2 23 23  GLUCOSE 91 92  BUN 31* 23*  CREATININE 1.82* 1.07*  CALCIUM 7.6* 8.7*     Studies/Results: No results found.  Assessment/Plan: Right ureteral stone with pyelonephritis and ARF s/p right stent on 7/29  - Ok for discharge from urologic standpoint - Recommend two week course of empiric antibiotics given lack of culture date - We will arrange outpatient follow up with Dr. Annabell Howells for discussion of treatment options for her ureteral stone   LOS: 2 days   Carolin Coy 06/12/2016, 6:57 AM  I spoke to Jalayna about the treatment options for her stone and will get her set up for right ESWL in about a week.    I have reviewed the risks of bleeding, infection, injury to the kidney or adjacent structures, failure of fragmentation with the need for secondary procedures and sedation risks.

## 2016-06-12 NOTE — Progress Notes (Signed)
Patient discharged home with family, discharge instructions given and explained to patient and she verbalized understanding, denies any pain/distress. No wound noted, skin intact. Accompanied home by husband.

## 2016-06-12 NOTE — Discharge Summary (Signed)
Physician Discharge Summary  Cheryl Rivera WGY:[id number] DOB: 11-21-1975 DOA: 06/09/2016  PCP: No primary care provider on file.  Admit date: 06/09/2016 Discharge date: 06/12/2016   Recommendations for Outpatient Follow-Up:   1. outpatient ESWL   Discharge Diagnosis:   Active Problems:   Pyelonephritis   Kidney stone   Discharge disposition:  Home  Discharge Condition: Improved.  Diet recommendation:  Regular.  Wound care: None.   History of Present Illness:   Patient is a 40 yo female with history of anxiety and prior renal stones who came with cc of right flank pain. - The pain started 2-3 days ago, radiating sometimes to pubic area, associated with dysuria and urgency/frequency and hemauria occasionally. She also had a fever. No N/V/D/C but she has not eaten anything today due to pain. No chest pain/dyspnea/cough. She had mild dizziness that has resolved.  - She had renal stones diagnosed in 2011 and was supposed to get lithotripsy but she did not have insurance so she kept postponing the procedure despite having periodic symptoms.  - She has been using NSAIDs for pain as needed up to 20-3 times daily.    Hospital Course by Problem:   Sepsis due to pyelonephritis:  Ucx and Bcx sent- 14 days Started on Ceftriaxone Likely related to renal stones found in CT scan resolved - pain meds- #15 given  Mod right hydronephrosis:  Due to right renal stone S/p stent- urology follow up outpatient for ESWL  AKI:  Improving with IVF Avoid NSAIDs  Hypokalemia:  replaced  Anxiety: cont home meds.     Medical Consultants:    urology   Discharge Exam:   Vitals:   06/11/16 2057 06/12/16 0534  BP: 133/86 130/85  Pulse: 74 83  Resp: 20 18  Temp: 99.3 F (37.4 C) 98.3 F (36.8 C)   Vitals:   06/11/16 0557 06/11/16 1437 06/11/16 2057 06/12/16 0534  BP: 114/67 117/82 133/86 130/85  Pulse: 89 76 74 83  Resp: 18 20 20 18   Temp: 98 F (36.7 C) 98.6 F  (37 C) 99.3 F (37.4 C) 98.3 F (36.8 C)  TempSrc: Oral Oral Oral Oral  SpO2: 99% 100% 100% 100%  Weight:      Height:        Gen:  NAD   The results of significant diagnostics from this hospitalization (including imaging, microbiology, ancillary and laboratory) are listed below for reference.     Procedures and Diagnostic Studies:   Ct Renal Stone Study  Result Date: 06/09/2016 CLINICAL DATA:  Pt has hx of 35mm kidney stone. Pt does not know if it ever passed. It was dx "2years ago". Pt here with rt flank pain and fever 101 at home. Pt denies blood in urine today but states there has been. Burning with urination. Pt also states generalized body aches. EXAM: CT ABDOMEN AND PELVIS WITHOUT CONTRAST TECHNIQUE: Multidetector CT imaging of the abdomen and pelvis was performed following the standard protocol without IV contrast. COMPARISON:  04/01/2010 FINDINGS: Lung bases: Minor dependent subsegmental atelectasis. Otherwise clear. Heart normal size. Hepatobiliary: Fatty infiltration of the liver. No liver mass or focal lesion. Normal gallbladder. No bile duct dilation. Spleen, pancreas, adrenal glands:  Normal. Kidneys, ureters, bladder: There is moderate right hydronephrosis with swelling of the right kidney caused by a 1 cm proximal ureteral stone. There is relative hypoattenuation along the posterior aspect of the mid to upper pole the right kidney. There are no other right ureteral stones. There are  bilateral nonobstructing intrarenal stones. No discrete renal masses. No left hydronephrosis. Normal left ureter. Bladder is unremarkable. Uterus and adnexa are unremarkable. Lymph nodes:  No adenopathy. Gastrointestinal:  Normal.  Normal appendix visualized. Musculoskeletal: Mild disc degenerative change at L1-L2. No osteoblastic or osteolytic lesions. IMPRESSION: 1. 1 cm stone in the proximal right ureter causes moderate right hydronephrosis. In addition, there is relative low attenuation along the  posterior aspect of the mid to upper pole right kidney. This raises the possibility of pyelonephritis. 2. No other acute findings. 3. Bilateral nonobstructing intrarenal stones. 4. Hepatic steatosis. Electronically Signed   By: Amie Portland M.D.   On: 06/09/2016 22:40    Labs:   Basic Metabolic Panel:  Recent Labs Lab 06/09/16 2002 06/10/16 0517 06/10/16 1209 06/11/16 0504 06/12/16 0448  NA 134* 135 137 138 140  K 2.7* 2.8* 3.0* 3.5 4.2  CL 92* 100* 105 109 113*  CO2 27 25 24 23 23   GLUCOSE 110* 106* 100* 91 92  BUN 46* 48* 45* 31* 23*  CREATININE 3.56* 3.81* 2.95* 1.82* 1.07*  CALCIUM 8.7* 7.4* 7.5* 7.6* 8.7*  MG  --   --  1.8  --   --    GFR Estimated Creatinine Clearance: 75 mL/min (by C-G formula based on SCr of 1.07 mg/dL). Liver Function Tests: No results for input(s): AST, ALT, ALKPHOS, BILITOT, PROT, ALBUMIN in the last 168 hours. No results for input(s): LIPASE, AMYLASE in the last 168 hours. No results for input(s): AMMONIA in the last 168 hours. Coagulation profile No results for input(s): INR, PROTIME in the last 168 hours.  CBC:  Recent Labs Lab 06/09/16 2002 06/10/16 0517 06/11/16 0504 06/12/16 0448  WBC 26.7* 16.2* 8.1 6.7  NEUTROABS 24.0*  --   --   --   HGB 11.4* 9.0* 8.3* 8.6*  HCT 34.9* 27.3* 26.8* 26.9*  MCV 81.5 81.5 83.2 83.3  PLT 399 302 324 366   Cardiac Enzymes: No results for input(s): CKTOTAL, CKMB, CKMBINDEX, TROPONINI in the last 168 hours. BNP: Invalid input(s): POCBNP CBG: No results for input(s): GLUCAP in the last 168 hours. D-Dimer No results for input(s): DDIMER in the last 72 hours. Hgb A1c No results for input(s): HGBA1C in the last 72 hours. Lipid Profile No results for input(s): CHOL, HDL, LDLCALC, TRIG, CHOLHDL, LDLDIRECT in the last 72 hours. Thyroid function studies No results for input(s): TSH, T4TOTAL, T3FREE, THYROIDAB in the last 72 hours.  Invalid input(s): FREET3 Anemia work up No results for input(s):  VITAMINB12, FOLATE, FERRITIN, TIBC, IRON, RETICCTPCT in the last 72 hours. Microbiology Recent Results (from the past 240 hour(s))  Culture, Urine     Status: Abnormal   Collection Time: 06/10/16 12:08 AM  Result Value Ref Range Status   Specimen Description URINE, CLEAN CATCH  Final   Special Requests NONE  Final   Culture MULTIPLE SPECIES PRESENT, SUGGEST RECOLLECTION (A)  Final   Report Status 06/11/2016 FINAL  Final  Culture, blood (routine x 2)     Status: None (Preliminary result)   Collection Time: 06/10/16  1:30 AM  Result Value Ref Range Status   Specimen Description BLOOD LEFT ANTECUBITAL  Final   Special Requests BOTTLES DRAWN AEROBIC AND ANAEROBIC  Final   Culture   Final    NO GROWTH 1 DAY Performed at [facility]    Report Status PENDING  Incomplete  Culture, blood (routine x 2)     Status: None (Preliminary result)   Collection  Time: 06/10/16  1:31 AM  Result Value Ref Range Status   Specimen Description BLOOD RIGHT ANTECUBITAL  Final   Special Requests BOTTLES DRAWN AEROBIC AND ANAEROBIC  Final   Culture   Final    NO GROWTH 1 DAY Performed at [facility]    Report Status PENDING  Incomplete  Surgical pcr screen     Status: None   Collection Time: 06/10/16  7:54 AM  Result Value Ref Range Status   MRSA, PCR NEGATIVE NEGATIVE Final   Staphylococcus aureus NEGATIVE NEGATIVE Final    Comment:        The Xpert SA Assay (FDA approved for NASAL specimens in patients over 93 years of age), is one component of a comprehensive surveillance program.  Test performance has been validated by [facility] for patients greater than or equal to 58 year old. It is not intended to diagnose infection nor to guide or monitor treatment.      Discharge Instructions:   Discharge Instructions    Diet general    Complete by:  As directed   Discharge instructions    Complete by:  As directed   Follow up with urology for ESWL   Increase activity  slowly    Complete by:  As directed       Medication List    STOP taking these medications   ibuprofen 200 MG tablet Commonly known as:  ADVIL,MOTRIN   naproxen sodium 220 MG tablet Commonly known as:  ANAPROX     TAKE these medications   amphetamine-dextroamphetamine 30 MG tablet Commonly known as:  ADDERALL Take 30 mg by mouth 2 (two) times daily as needed. For focus.   cefUROXime 500 MG tablet Commonly known as:  CEFTIN Take 1 tablet (500 mg total) by mouth 2 (two) times daily with a meal.   clonazePAM 0.5 MG tablet Commonly known as:  KLONOPIN Take 0.5 mg by mouth 2 (two) times daily as needed for anxiety.   HYDROcodone-acetaminophen 5-325 MG tablet Commonly known as:  NORCO/VICODIN Take 1-2 tablets by mouth every 4 (four) hours as needed for severe pain.   methocarbamol 750 MG tablet Commonly known as:  ROBAXIN Take 750 mg by mouth 3 (three) times daily as needed for spasms.   S[place]PAS PAIN RELIEF PATCH EX Place 1 application onto the skin daily as needed (for pain.).      Follow-up Information    Anner Crete, MD .   Specialty:  Urology Why:  for ESWL Contact information: [address] [place] [address] [phone]            Time coordinating discharge: 35 min  Signed:  Roshan Salamon U Jabreel Chimento   [facility] 06/12/2016, 12:55 PM

## 2016-06-14 ENCOUNTER — Other Ambulatory Visit: Payer: Self-pay | Admitting: Urology

## 2016-06-15 LAB — CULTURE, BLOOD (ROUTINE X 2)
CULTURE: NO GROWTH
Culture: NO GROWTH

## 2016-06-16 ENCOUNTER — Encounter (HOSPITAL_COMMUNITY): Payer: Self-pay | Admitting: *Deleted

## 2016-06-19 ENCOUNTER — Ambulatory Visit (HOSPITAL_COMMUNITY)
Admission: RE | Admit: 2016-06-19 | Discharge: 2016-06-19 | Disposition: A | Discharge status: Home / Self Care | Payer: BLUE CROSS/BLUE SHIELD | Source: Ambulatory Visit | Attending: Urology | Admitting: Urology

## 2016-06-19 ENCOUNTER — Ambulatory Visit (HOSPITAL_COMMUNITY): Payer: BLUE CROSS/BLUE SHIELD

## 2016-06-19 ENCOUNTER — Encounter (HOSPITAL_COMMUNITY): Payer: Self-pay | Admitting: *Deleted

## 2016-06-19 ENCOUNTER — Encounter (HOSPITAL_COMMUNITY)
Admission: RE | Disposition: A | Discharge status: Home / Self Care | Payer: Self-pay | Source: Ambulatory Visit | Attending: Urology

## 2016-06-19 DIAGNOSIS — N201 Calculus of ureter: Secondary | ICD-10-CM | POA: Insufficient documentation

## 2016-06-19 DIAGNOSIS — Z87891 Personal history of nicotine dependence: Secondary | ICD-10-CM | POA: Diagnosis not present

## 2016-06-19 DIAGNOSIS — N200 Calculus of kidney: Secondary | ICD-10-CM

## 2016-06-19 LAB — PREGNANCY, URINE: PREG TEST UR: NEGATIVE

## 2016-06-19 SURGERY — LITHOTRIPSY, ESWL
Anesthesia: LOCAL | Laterality: Right

## 2016-06-19 MED ORDER — CIPROFLOXACIN HCL 500 MG PO TABS
500.0000 mg | ORAL_TABLET | ORAL | Status: AC
  Administered 2016-06-19: 500 mg via ORAL
  Filled 2016-06-19: qty 1

## 2016-06-19 MED ORDER — HYDROCODONE-ACETAMINOPHEN 5-325 MG PO TABS: 1.0000 | ORAL_TABLET | ORAL | 0 refills | Status: DC | PRN

## 2016-06-19 MED ORDER — DIAZEPAM 5 MG PO TABS
10.0000 mg | ORAL_TABLET | ORAL | Status: AC
  Administered 2016-06-19: 10 mg via ORAL
  Filled 2016-06-19: qty 2

## 2016-06-19 MED ORDER — SODIUM CHLORIDE 0.9 % IV SOLN
INTRAVENOUS | Status: DC
  Administered 2016-06-19: 07:00:00 via INTRAVENOUS

## 2016-06-19 MED ORDER — DIPHENHYDRAMINE HCL 25 MG PO CAPS
25.0000 mg | ORAL_CAPSULE | ORAL | Status: AC
  Administered 2016-06-19: 25 mg via ORAL
  Filled 2016-06-19: qty 1

## 2016-06-19 NOTE — Discharge Instructions (Signed)
1 - You may have urinary urgency (bladder spasms), bloody urine on / off, and pass small stone fragments x few days. This is normal. ° °2 - Call MD or go to ER for fever >102, severe pain / nausea / vomiting not relieved by medications, or acute change in medical status °

## 2016-06-19 NOTE — Brief Op Note (Signed)
06/19/2016  9:11 AM  PATIENT:  Cheryl Rivera  40 y.o. female  PRE-OPERATIVE DIAGNOSIS:  right proximal stone  POST-OPERATIVE DIAGNOSIS:  * No post-op diagnosis entered *  PROCEDURE:  Procedure(s): RIGHT EXTRACORPOREAL SHOCK WAVE LITHOTRIPSY (ESWL) (Right)  SURGEON:  Surgeon(s) and Role:    * Sebastian Acheheodore Antonisha Waskey, MD - Primary  PHYSICIAN ASSISTANT:   ASSISTANTS: none   ANESTHESIA:   MAC  EBL:  No intake/output data recorded.  BLOOD ADMINISTERED:none  DRAINS: none   LOCAL MEDICATIONS USED:  NONE  SPECIMEN:  No Specimen  DISPOSITION OF SPECIMEN:  N/A  COUNTS:  YES  TOURNIQUET:  * No tourniquets in log *  DICTATION: .Note written in paper chart  PLAN OF CARE: Discharge to home after PACU  PATIENT DISPOSITION:  Short Stay   Delay start of Pharmacological VTE agent (>24hrs) due to surgical blood loss or risk of bleeding: yes

## 2016-06-19 NOTE — H&P (Signed)
Cheryl Rivera is an 40 y.o. female.    Chief Complaint: Pre-op RIGHT shockwave lithotripsy  HPI:   1 - RIGHT ureteral stone - 8mm stone at level of L3 by ER CT 05/2016, 1050HU. Underwent JJ stent 7/29 at time of clinical obstructed pyelonephritis. UCX at that time non-clonal and has been on cefuroxime.  Today "Cheryl Rivera" is seen to proceed with RIGHT shockwave lithotripsy. She has been on cefuroxime and w/o interval fevers.   Past Medical History:  Diagnosis Date  . Kidney stone     Past Surgical History:  Procedure Laterality Date  . CYSTOSCOPY WITH RETROGRADE PYELOGRAM, URETEROSCOPY AND STENT PLACEMENT Right 06/10/2016   Procedure: CYSTOSCOPY WITH RETROGRADE PYELOGRAM, AND RIGHT URETERAL STENT PLACEMENT;  Surgeon: Heloise PurpuraLester Borden, MD;  Location: [facility];  Service: Urology;  Laterality: Right;  . LITHOTRIPSY    . TUBAL LIGATION      History reviewed. No pertinent family history. Social History:  reports that she has quit smoking. She has never used smokeless tobacco. She reports that she does not drink alcohol or use drugs.  Allergies: No Known Allergies  No prescriptions prior to admission.    No results found for this or any previous visit (from the past 48 hour(s)). No results found.  Review of Systems  Constitutional: Negative.  Negative for chills and fever.  HENT: Negative.   Eyes: Negative.   Respiratory: Negative.   Cardiovascular: Negative.   Gastrointestinal: Negative.   Genitourinary: Positive for frequency.  Musculoskeletal: Negative.   Skin: Negative.   Neurological: Negative.   Endo/Heme/Allergies: Negative.   Psychiatric/Behavioral: Negative.     Last menstrual period 06/05/2016. Physical Exam  Constitutional: She appears well-developed.  HENT:  Head: Normocephalic.  Eyes: Pupils are equal, round, and reactive to light.  Neck: Normal range of motion.  Cardiovascular: Normal rate.   Respiratory: Effort normal.  GI: Soft.  Genitourinary:   Genitourinary Comments: No CVAT at present  Neurological: She is alert.  Skin: Skin is warm.  Psychiatric: She has a normal mood and affect. Her behavior is normal. Judgment and thought content normal.     Assessment/Plan  1 - RIGHT ureteral stone - We rediscussed shockwave lithotripsy in detail as well as my "rule of 9s" with stones <1069mm, less than 900 HU, and skin to stone distance <9cm having approximately 90% treatment success with single session of treatment. We then readdressed how stones that are larger, more dense, and in patients with less favorable anatomy have incrementally decreased success rates. We rediscussed risks including, bleeding, infection, hematoma, loss of kidney, need for staged therapy, need for adjunctive therapy and requirement to refrain from any anticoagulants, anti-platelet or aspirin-like products peri-procedureally.   After careful consideration, the patient has chosen to proceed today as planned.   Sebastian AcheMANNY, Quadir Muns, MD 06/19/2016, 6:04 AM

## 2018-03-07 ENCOUNTER — Emergency Department (HOSPITAL_BASED_OUTPATIENT_CLINIC_OR_DEPARTMENT_OTHER)
Admission: EM | Admit: 2018-03-07 | Discharge: 2018-03-07 | Disposition: A | Discharge status: Home / Self Care | Payer: BLUE CROSS/BLUE SHIELD | Attending: Emergency Medicine | Admitting: Emergency Medicine

## 2018-03-07 ENCOUNTER — Other Ambulatory Visit: Payer: Self-pay

## 2018-03-07 ENCOUNTER — Encounter (HOSPITAL_BASED_OUTPATIENT_CLINIC_OR_DEPARTMENT_OTHER): Payer: Self-pay | Admitting: *Deleted

## 2018-03-07 DIAGNOSIS — K029 Dental caries, unspecified: Secondary | ICD-10-CM | POA: Diagnosis not present

## 2018-03-07 DIAGNOSIS — M273 Alveolitis of jaws: Secondary | ICD-10-CM | POA: Diagnosis not present

## 2018-03-07 DIAGNOSIS — Z87891 Personal history of nicotine dependence: Secondary | ICD-10-CM | POA: Diagnosis not present

## 2018-03-07 DIAGNOSIS — Z79899 Other long term (current) drug therapy: Secondary | ICD-10-CM | POA: Insufficient documentation

## 2018-03-07 DIAGNOSIS — K0889 Other specified disorders of teeth and supporting structures: Secondary | ICD-10-CM | POA: Diagnosis present

## 2018-03-07 MED ORDER — AMOXICILLIN 500 MG PO CAPS: 500.0000 mg | ORAL_CAPSULE | Freq: Three times a day (TID) | ORAL | 0 refills | Status: DC

## 2018-03-07 MED ORDER — AMOXICILLIN 500 MG PO CAPS
500.0000 mg | ORAL_CAPSULE | Freq: Once | ORAL | Status: AC
  Administered 2018-03-07: 500 mg via ORAL
  Filled 2018-03-07: qty 1

## 2018-03-07 NOTE — ED Notes (Signed)
Pt verbalizes understanding of d/c instructions and denies any further needs at this time. 

## 2018-03-07 NOTE — ED Triage Notes (Signed)
Pt c/o right dental pain x 1 day, removed tooth at home and here for ABX

## 2018-03-07 NOTE — ED Provider Notes (Addendum)
[facility] Provider Note: Cheryl Dell, MD, FACEP  CSN: [id number] MRN: [id number] ARRIVAL: 03/07/18 at 0026 ROOM: [facility]12/[facility]12   CHIEF COMPLAINT  Dental Pain   HISTORY OF PRESENT ILLNESS  03/07/18 2:14 AM Cheryl Rivera is a 42 y.o. female who has a right lower second premolar that recently fractured.  She states she remove the rest of the tooth herself with a "dental instrument".  Pain was relieved at first but has returned over the last 24 hours.  She is concerned she may have a dry socket.  Pain is moderate and not adequately relieved with over-the-counter analgesics.  She is requesting antibiotics.   Past Medical History:  Diagnosis Date  . Kidney stone     Past Surgical History:  Procedure Laterality Date  . CYSTOSCOPY WITH RETROGRADE PYELOGRAM, URETEROSCOPY AND STENT PLACEMENT Right 06/10/2016   Procedure: CYSTOSCOPY WITH RETROGRADE PYELOGRAM, AND RIGHT URETERAL STENT PLACEMENT;  Surgeon: Heloise Purpura, MD;  Location: [facility];  Service: Urology;  Laterality: Right;  . LITHOTRIPSY    . TUBAL LIGATION      History reviewed. No pertinent family history.  Social History   Tobacco Use  . Smoking status: Former Games developer  . Smokeless tobacco: Never Used  Substance Use Topics  . Alcohol use: No  . Drug use: No    Prior to Admission medications   Medication Sig Start Date End Date Taking? Authorizing Provider  amphetamine-dextroamphetamine (ADDERALL) 30 MG tablet Take 30 mg by mouth 2 (two) times daily as needed. For focus. 05/18/16   [provider]  cefUROXime (CEFTIN) 500 MG tablet Take 1 tablet (500 mg total) by mouth 2 (two) times daily with a meal. 06/12/16   Joseph Art, DO  clonazePAM (KLONOPIN) 0.5 MG tablet Take 0.5 mg by mouth 2 (two) times daily as needed for anxiety. 05/30/16   [provider]  HYDROcodone-acetaminophen (NORCO/VICODIN) 5-325 MG tablet Take 1-2 tablets by mouth every 4 (four) hours as needed for severe pain. After  lithotripsy 06/19/16   Sebastian Ache, MD  [facility] PAIN RELIEF PATCH EX) Place 1 application onto the skin daily as needed (for pain.).    [provider]  methocarbamol (ROBAXIN) 750 MG tablet Take 750 mg by mouth 3 (three) times daily as needed for spasms. 04/04/16   [provider]    Allergies Patient has no known allergies.   REVIEW OF SYSTEMS  Negative except as noted here or in the History of Present Illness.   PHYSICAL EXAMINATION  Initial Vital Signs Blood pressure (!) 183/105, pulse 84, temperature 98.4 F (36.9 C), temperature source Oral, resp. rate 18, height 5\' 6"  (1.676 m), weight 90.7 kg (200 lb), last menstrual period 02/14/2018, SpO2 99 %.  Examination General: Well-developed, well-nourished female in no acute distress; appearance consistent with age of record HENT: normocephalic; atraumatic; absent right lower second premolar with empty extraction socket lacking clot, cavity seen on the adjacent first molar Eyes: pupils equal, round and reactive to light; extraocular muscles intact Neck: supple Heart: regular rate and rhythm Lungs: clear to auscultation bilaterally Abdomen: soft; nondistended; nontender; bowel sounds present Extremities: No deformity; full range of motion Neurologic: Awake, alert and oriented; motor function intact in all extremities and symmetric; no facial droop Skin: Warm and dry Psychiatric: Normal mood and affect   RESULTS  Summary of this visit's results, reviewed by myself:   EKG Interpretation  Date/Time:    Ventricular Rate:    PR Interval:  QRS Duration:   QT Interval:    QTC Calculation:   R Axis:     Text Interpretation:        Laboratory Studies: No results found for this or any previous visit (from the past 24 hour(s)). Imaging Studies: No results found.  ED COURSE and MDM  Nursing notes and initial vitals signs, including pulse oximetry, reviewed.  Vitals:   03/07/18 0035 03/07/18  0036  BP:  (!) 183/105  Pulse:  84  Resp:  18  Temp:  98.4 F (36.9 C)  TempSrc:  Oral  SpO2:  99%  Weight: 90.7 kg (200 lb)   Height: 5\' 6"  (1.676 m)    Socket filled with eugenol based dry socket paste.  Patient started on amoxicillin.  She was encouraged to see a dentist later today.  She states she has a Education officer, communitydentist but we will also provide referral to our dentist on call.  She was advised to refer to a licensed dentist for extractions in the future.  PROCEDURES    ED DIAGNOSES     ICD-10-CM   1. Dry tooth socket M27.3   2. Dental caries K02.9        Iola Turri, MD 03/07/18 [id number]    Paula LibraMolpus, Xylina Rhoads, MD 03/07/18 [id number]

## 2021-03-03 ENCOUNTER — Other Ambulatory Visit: Payer: Self-pay | Admitting: Obstetrics and Gynecology

## 2021-03-03 DIAGNOSIS — R928 Other abnormal and inconclusive findings on diagnostic imaging of breast: Secondary | ICD-10-CM

## 2021-03-28 ENCOUNTER — Ambulatory Visit
Admission: RE | Admit: 2021-03-28 | Discharge: 2021-03-28 | Disposition: A | Discharge status: Home / Self Care | Payer: BLUE CROSS/BLUE SHIELD | Source: Ambulatory Visit | Attending: Obstetrics and Gynecology | Admitting: Obstetrics and Gynecology

## 2021-03-28 ENCOUNTER — Ambulatory Visit
Admission: RE | Admit: 2021-03-28 | Discharge: 2021-03-28 | Disposition: A | Discharge status: Home / Self Care | Payer: Medicare Other | Source: Ambulatory Visit | Attending: Obstetrics and Gynecology | Admitting: Obstetrics and Gynecology

## 2021-03-28 ENCOUNTER — Other Ambulatory Visit: Payer: Self-pay

## 2021-03-28 DIAGNOSIS — R928 Other abnormal and inconclusive findings on diagnostic imaging of breast: Secondary | ICD-10-CM

## 2022-07-24 ENCOUNTER — Emergency Department (HOSPITAL_BASED_OUTPATIENT_CLINIC_OR_DEPARTMENT_OTHER): Payer: Medicare Other

## 2022-07-24 ENCOUNTER — Emergency Department (HOSPITAL_BASED_OUTPATIENT_CLINIC_OR_DEPARTMENT_OTHER)
Admission: EM | Admit: 2022-07-24 | Discharge: 2022-07-24 | Disposition: A | Discharge status: Home / Self Care | Payer: Medicare Other | Attending: Emergency Medicine | Admitting: Emergency Medicine

## 2022-07-24 ENCOUNTER — Other Ambulatory Visit: Payer: Self-pay

## 2022-07-24 ENCOUNTER — Encounter (HOSPITAL_BASED_OUTPATIENT_CLINIC_OR_DEPARTMENT_OTHER): Payer: Self-pay | Admitting: Emergency Medicine

## 2022-07-24 DIAGNOSIS — Z23 Encounter for immunization: Secondary | ICD-10-CM | POA: Insufficient documentation

## 2022-07-24 DIAGNOSIS — S61011A Laceration without foreign body of right thumb without damage to nail, initial encounter: Secondary | ICD-10-CM | POA: Insufficient documentation

## 2022-07-24 DIAGNOSIS — S65401A Unspecified injury of blood vessel of right thumb, initial encounter: Secondary | ICD-10-CM | POA: Diagnosis present

## 2022-07-24 DIAGNOSIS — Y92410 Unspecified street and highway as the place of occurrence of the external cause: Secondary | ICD-10-CM | POA: Insufficient documentation

## 2022-07-24 DIAGNOSIS — T148XXA Other injury of unspecified body region, initial encounter: Secondary | ICD-10-CM

## 2022-07-24 MED ORDER — CEPHALEXIN 500 MG PO CAPS: 500.0000 mg | ORAL_CAPSULE | Freq: Four times a day (QID) | ORAL | 0 refills | Status: AC

## 2022-07-24 MED ORDER — LIDOCAINE HCL 2 % IJ SOLN
10.0000 mL | Freq: Once | INTRAMUSCULAR | Status: AC
  Administered 2022-07-24: 200 mg via INTRADERMAL
  Filled 2022-07-24: qty 20

## 2022-07-24 MED ORDER — TETANUS-DIPHTH-ACELL PERTUSSIS 5-2.5-18.5 LF-MCG/0.5 IM SUSY
0.5000 mL | PREFILLED_SYRINGE | Freq: Once | INTRAMUSCULAR | Status: AC
  Administered 2022-07-24: 0.5 mL via INTRAMUSCULAR
  Filled 2022-07-24: qty 0.5

## 2022-07-24 MED ORDER — OXYCODONE-ACETAMINOPHEN 5-325 MG PO TABS
1.0000 | ORAL_TABLET | ORAL | Status: AC | PRN
  Administered 2022-07-24 (×2): 1 via ORAL
  Filled 2022-07-24 (×2): qty 1

## 2022-07-24 MED ORDER — OXYCODONE-ACETAMINOPHEN 5-325 MG PO TABS: 1.0000 | ORAL_TABLET | ORAL | 0 refills | Status: DC | PRN

## 2022-07-24 MED ORDER — CEPHALEXIN 500 MG PO CAPS: 500.0000 mg | ORAL_CAPSULE | Freq: Four times a day (QID) | ORAL | 0 refills | Status: DC
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
## 2022-07-24 NOTE — Discharge Instructions (Signed)
Your history, exam, evaluation today revealed bruising to both arms, skin tears to the left forearm, and a laceration to the right thumb.  Initially, we were concerned about the swelling and bruising to the base of the thumb developing into a compartment syndrome however, after reassessment and discussion with 2 different hand surgery teams, they feel it is appropriate for discharge home and outpatient follow-up.  Due to the length of time it was not repaired today, we do feel is appropriate for antibiotics and we updated her tetanus.  The x-rays did not show any acute bony injury but you can have tenderness, ligamentous, muscular, and other joint injuries that do not show up on the x-rays.  Please follow-up with the hand doctor for further guidance on management and reassessment.  Please watch for signs and symptoms of infection as even after antibiotics, washout multiple times, and medications wounds can still get infected.  Please be careful not to bend your thumb and ripped the sutures out.  If any symptoms change or worsen acutely, please return to the nearest emergency department.
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
## 2022-08-08 ENCOUNTER — Inpatient Hospital Stay (HOSPITAL_COMMUNITY): Admission: RE | Admit: 2022-08-08 | Payer: Medicare Other | Source: Ambulatory Visit

## 2022-08-09 ENCOUNTER — Telehealth: Payer: Medicare Other

## 2022-08-09 ENCOUNTER — Ambulatory Visit: Payer: Medicare Other

## 2022-08-25 ENCOUNTER — Ambulatory Visit (INDEPENDENT_AMBULATORY_CARE_PROVIDER_SITE_OTHER): Payer: Medicare Other | Admitting: Nurse Practitioner

## 2022-08-25 ENCOUNTER — Encounter (HOSPITAL_BASED_OUTPATIENT_CLINIC_OR_DEPARTMENT_OTHER): Payer: Self-pay | Admitting: Nurse Practitioner

## 2022-08-25 VITALS — BP 187/113 | HR 91 | Ht 65.0 in | Wt 221.5 lb

## 2022-08-25 DIAGNOSIS — N951 Menopausal and female climacteric states: Secondary | ICD-10-CM | POA: Diagnosis not present

## 2022-08-25 DIAGNOSIS — R030 Elevated blood-pressure reading, without diagnosis of hypertension: Secondary | ICD-10-CM

## 2022-08-25 DIAGNOSIS — D509 Iron deficiency anemia, unspecified: Secondary | ICD-10-CM

## 2022-08-25 DIAGNOSIS — E785 Hyperlipidemia, unspecified: Secondary | ICD-10-CM

## 2022-08-25 DIAGNOSIS — S29019A Strain of muscle and tendon of unspecified wall of thorax, initial encounter: Secondary | ICD-10-CM | POA: Diagnosis not present

## 2022-08-25 DIAGNOSIS — Z0000 Encounter for general adult medical examination without abnormal findings: Secondary | ICD-10-CM

## 2022-08-25 MED ORDER — CLONIDINE HCL 0.1 MG PO TABS: 0.1000 mg | ORAL_TABLET | Freq: Two times a day (BID) | ORAL | 11 refills | Status: AC

## 2022-08-25 MED ORDER — CYCLOBENZAPRINE HCL 10 MG PO TABS: 10.0000 mg | ORAL_TABLET | Freq: Every day | ORAL | 1 refills | Status: DC

## 2022-08-25 NOTE — Patient Instructions (Signed)
Thank you for choosing [facility] for your Primary Care needs. I am excited for the opportunity to partner with you to meet your health care goals. It was a pleasure meeting you today!  Recommendations from today's visit: I have sent in a medication called Clonidine. This helps with hot flashes associated with menopause. You will take this twice a day. If you feel like the medication is working well for you, we can continue with this. When you first start I would like you to get up and move slowly as this can make you feel a little dizzy at first, but this should go away. It should get better in the first 1-2 weeks with both the dizzy sensation and the hot flashes.  I will send the referral to Dr. Burnard Bunting for knee pain I will send in flexeril for your back and I have printed stretches that I would like you to do at home to help your back.  I would like you to check your blood pressure at home once a day and keep a record of this so we can talk about it when we follow-up.    Information on diet, exercise, and health maintenance recommendations are listed below. This is information to help you be sure you are on track for optimal health and monitoring.   Please look over this and let us know if you have any questions or if you have completed any of the health maintenance outside of [place] so that we can be sure your records are up to date.  ___________________________________________________________ About Me: I am an Adult-Geriatric Nurse Practitioner with a background in caring for patients for more than 20 years with a strong intensive care background. I provide primary care and sports medicine services to patients age 56 and older within this office. My education had a strong focus on caring for the older adult population, which I am passionate about. I am also the director of the APP Fellowship with [facility]   My desire is to provide you with the best  service through preventive medicine and supportive care. I consider you a part of the medical team and value your input. I work diligently to ensure that you are heard and your needs are met in a safe and effective manner. I want you to feel comfortable with me as your provider and want you to know that your health concerns are important to me.  For your information, our office hours are: Monday, Tuesday, and Thursday 8:00 AM - 5:00 PM Wednesday and Friday 8:00 AM - 12:00 PM.   In my time away from the office I am teaching new APP's within the system and am unavailable, but my partner, Dr. Burnard Bunting is in the office for emergent needs.   If you have questions or concerns, please call our office at [phone] or send [place] a MyChart message and we will respond as quickly as possible.  ____________________________________________________________ MyChart:  For all urgent or time sensitive needs we ask that you please call the office to avoid delays. Our number is [phone]. MyChart is not constantly monitored and due to the large volume of messages a day, replies may take up to 72 business hours.  MyChart Policy: MyChart allows for you to see your visit notes, after visit summary, provider recommendations, lab and tests results, make an appointment, request refills, and contact your provider or the office for non-urgent questions or concerns. Providers are seeing patients during normal  business hours and do not have built in time to review MyChart messages.  We ask that you allow a minimum of 3 business days for responses to [employer]. For this reason, please do not send urgent requests through [place]. Please call the office at [phone]. New and ongoing conditions may require a visit. We have virtual and in person visit available for your convenience.  Complex MyChart concerns may require a visit. Your provider may request you schedule a virtual or in person visit to ensure we are  providing the best care possible. MyChart messages sent after 11:00 AM on Friday will not be received by the provider until Monday morning.    Lab and Test Results: You will receive your lab and test results on MyChart as soon as they are completed and results have been sent by the lab or testing facility. Due to this service, you will receive your results BEFORE your provider.  I review lab and tests results each morning prior to seeing patients. Some results require collaboration with other providers to ensure you are receiving the most appropriate care. For this reason, we ask that you please allow a minimum of 3-5 business days from the time the ALL results have been received for your provider to receive and review lab and test results and contact you about these.  Most lab and test result comments from the provider will be sent through [place]. Your provider may recommend changes to the plan of care, follow-up visits, repeat testing, ask questions, or request an office visit to discuss these results. You may reply directly to this message or call the office at [phone] to provide information for the provider or set up an appointment. In some instances, you will be called with test results and recommendations. Please let us know if this is preferred and we will make note of this in your chart to provide this for you.    If you have not heard a response to your lab or test results in 5 business days from all results returning to [place], please call the office to let us know. We ask that you please avoid calling prior to this time unless there is an emergent concern. Due to high call volumes, this can delay the resulting process.  After Hours: For all non-emergency after hours needs, please call the office at [phone] and select the option to reach the on-call provider service. On-call services are shared between multiple [place] offices and therefore it will not be possible to speak  directly with your provider. On-call providers may provide medical advice and recommendations, but are unable to provide refills for maintenance medications.  For all emergency or urgent medical needs after normal business hours, we recommend that you seek care at the closest Urgent Care or Emergency Department to ensure appropriate treatment in a timely manner.  [facility] [place] has a 24 hour emergency room located on the ground floor for your convenience.   Urgent Concerns During the Business Day Providers are seeing patients from 8AM to [place] with a busy schedule and are most often not able to respond to non-urgent calls until the end of the day or the next business day. If you should have URGENT concerns during the day, please call and speak to the nurse or schedule a same day appointment so that we can address your concern without delay.   Thank you, again, for choosing me as your health care partner. I appreciate your trust and look  forward to learning more about you.   Worthy Keeler, DNP, AGNP-c ___________________________________________________________  Health Maintenance Recommendations Screening Testing Mammogram Every 1 -2 years based on history and risk factors Starting at age 20 Pap Smear Ages 21-39 every 3 years Ages 43-65 every 5 years with HPV testing More frequent testing may be required based on results and history Colon Cancer Screening Every 1-10 years based on test performed, risk factors, and history Starting at age 53 Bone Density Screening Every 2-10 years based on history Starting at age 54 for women Recommendations for men differ based on medication usage, history, and risk factors AAA Screening One time ultrasound Men 44-62 years old who have every smoked Lung Cancer Screening Low Dose Lung CT every 12 months Age 9-80 years with a 30 pack-year smoking history who still smoke or who have quit within the last 15 years  Screening  Labs Routine  Labs: Complete Blood Count (CBC), Complete Metabolic Panel (CMP), Cholesterol (Lipid Panel) Every 6-12 months based on history and medications May be recommended more frequently based on current conditions or previous results Hemoglobin A1c Lab Every 3-12 months based on history and previous results Starting at age 22 or earlier with diagnosis of diabetes, high cholesterol, BMI >26, and/or risk factors Frequent monitoring for patients with diabetes to ensure blood sugar control Thyroid Panel (TSH w/ T3 & T4) Every 6 months based on history, symptoms, and risk factors May be repeated more often if on medication HIV One time testing for all patients 20 and older May be repeated more frequently for patients with increased risk factors or exposure Hepatitis C One time testing for all patients 30 and older May be repeated more frequently for patients with increased risk factors or exposure Gonorrhea, Chlamydia Every 12 months for all sexually active persons 13-24 years Additional monitoring may be recommended for those who are considered high risk or who have symptoms PSA Men 39-82 years old with risk factors Additional screening may be recommended from age 42-69 based on risk factors, symptoms, and history  Vaccine Recommendations Tetanus Booster All adults every 10 years Flu Vaccine All patients 6 months and older every year COVID Vaccine All patients 12 years and older Initial dosing with booster May recommend additional booster based on age and health history HPV Vaccine 2 doses all patients age 64-26 Dosing may be considered for patients over 26 Shingles Vaccine (Shingrix) 2 doses all adults 30 years and older Pneumonia (Pneumovax 23) All adults 76 years and older May recommend earlier dosing based on health history Pneumonia (Prevnar 68) All adults 45 years and older Dosed 1 year after Pneumovax 23  Additional Screening, Testing, and Vaccinations may be  recommended on an individualized basis based on family history, health history, risk factors, and/or exposure.  __________________________________________________________  Diet Recommendations for All Patients  I recommend that all patients maintain a diet low in saturated fats, carbohydrates, and cholesterol. While this can be challenging at first, it is not impossible and small changes can make big differences.  Things to try: Decreasing the amount of soda, sweet tea, and/or juice to one or less per day and replace with water While water is always the first choice, if you do not like water you may consider adding a water additive without sugar to improve the taste other sugar free drinks Replace potatoes with a brightly colored vegetable at dinner Use healthy oils, such as canola oil or olive oil, instead of butter or hard margarine Limit your bread intake to  two pieces or less a day Replace regular pasta with low carb pasta options Bake, broil, or grill foods instead of frying Monitor portion sizes  Eat smaller, more frequent meals throughout the day instead of large meals  An important thing to remember is, if you love foods that are not great for your health, you don't have to give them up completely. Instead, allow these foods to be a reward when you have done well. Allowing yourself to still have special treats every once in a while is a nice way to tell yourself thank you for working hard to keep yourself healthy.   Also remember that every day is a new day. If you have a bad day and "fall off the wagon", you can still climb right back up and keep moving along on your journey!  We have resources available to help you!  Some websites that may be helpful include: www.http://carter.biz/  Www.VeryWellFit.com _____________________________________________________________  Activity Recommendations for All Patients  I recommend that all adults get at least 20 minutes of moderate physical  activity that elevates your heart rate at least 5 days out of the week.  Some examples include: Walking or jogging at a pace that allows you to carry on a conversation Cycling (stationary bike or outdoors) Water aerobics Yoga Weight lifting Dancing If physical limitations prevent you from putting stress on your joints, exercise in a pool or seated in a chair are excellent options.  Do determine your MAXIMUM heart rate for activity: YOUR AGE - 220 = MAX HeartRate   Remember! Do not push yourself too hard.  Start slowly and build up your pace, speed, weight, time in exercise, etc.  Allow your body to rest between exercise and get good sleep. You will need more water than normal when you are exerting yourself. Do not wait until you are thirsty to drink. Drink with a purpose of getting in at least 8, 8 ounce glasses of water a day plus more depending on how much you exercise and sweat.    If you begin to develop dizziness, chest pain, abdominal pain, jaw pain, shortness of breath, headache, vision changes, lightheadedness, or other concerning symptoms, stop the activity and allow your body to rest. If your symptoms are severe, seek emergency evaluation immediately. If your symptoms are concerning, but not severe, please let us know so that we can recommend further evaluation.
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
## 2022-08-26 LAB — CBC WITH DIFF/PLATELET
Basophils Absolute: 0.1 10*3/uL (ref 0.0–0.2)
Basos: 1 %
EOS (ABSOLUTE): 0.5 10*3/uL — ABNORMAL HIGH (ref 0.0–0.4)
Eos: 6 %
Hematocrit: 35.5 % (ref 34.0–46.6)
Hemoglobin: 10.9 g/dL — ABNORMAL LOW (ref 11.1–15.9)
Immature Grans (Abs): 0 10*3/uL (ref 0.0–0.1)
Immature Granulocytes: 1 %
Lymphocytes Absolute: 2.3 10*3/uL (ref 0.7–3.1)
Lymphs: 28 %
MCH: 24.8 pg — ABNORMAL LOW (ref 26.6–33.0)
MCHC: 30.7 g/dL — ABNORMAL LOW (ref 31.5–35.7)
MCV: 81 fL (ref 79–97)
Monocytes Absolute: 0.5 10*3/uL (ref 0.1–0.9)
Monocytes: 6 %
Neutrophils Absolute: 4.8 10*3/uL (ref 1.4–7.0)
Neutrophils: 58 %
Platelets: 497 10*3/uL — ABNORMAL HIGH (ref 150–450)
RBC: 4.4 x10E6/uL (ref 3.77–5.28)
RDW: 16.3 % — ABNORMAL HIGH (ref 11.7–15.4)
WBC: 8.3 10*3/uL (ref 3.4–10.8)

## 2022-08-26 LAB — THYROID PANEL WITH TSH
Free Thyroxine Index: 1.3 (ref 1.2–4.9)
T3 Uptake Ratio: 21 % — ABNORMAL LOW (ref 24–39)
T4, Total: 6.3 ug/dL (ref 4.5–12.0)
TSH: 2.04 u[IU]/mL (ref 0.450–4.500)

## 2022-08-26 LAB — LIPID PANEL
Chol/HDL Ratio: 4.8 ratio — ABNORMAL HIGH (ref 0.0–4.4)
Cholesterol, Total: 276 mg/dL — ABNORMAL HIGH (ref 100–199)
HDL: 57 mg/dL (ref >39)
LDL Chol Calc (NIH): 154 mg/dL — ABNORMAL HIGH (ref 0–99)
Triglycerides: 349 mg/dL — ABNORMAL HIGH (ref 0–149)
VLDL Cholesterol Cal: 65 mg/dL — ABNORMAL HIGH (ref 5–40)

## 2022-08-26 LAB — COMPREHENSIVE METABOLIC PANEL
ALT: 12 IU/L (ref 0–32)
AST: 11 IU/L (ref 0–40)
Albumin/Globulin Ratio: 1.6 (ref 1.2–2.2)
Albumin: 4.1 g/dL (ref 3.9–4.9)
Alkaline Phosphatase: 131 IU/L — ABNORMAL HIGH (ref 44–121)
BUN/Creatinine Ratio: 18 (ref 9–23)
BUN: 16 mg/dL (ref 6–24)
Bilirubin Total: <0.2 mg/dL (ref 0.0–1.2)
CO2: 17 mmol/L — ABNORMAL LOW (ref 20–29)
Calcium: 9.5 mg/dL (ref 8.7–10.2)
Chloride: 102 mmol/L (ref 96–106)
Creatinine, Ser: 0.91 mg/dL (ref 0.57–1.00)
Globulin, Total: 2.6 g/dL (ref 1.5–4.5)
Glucose: 86 mg/dL (ref 70–99)
Potassium: 4.7 mmol/L (ref 3.5–5.2)
Sodium: 138 mmol/L (ref 134–144)
Total Protein: 6.7 g/dL (ref 6.0–8.5)
eGFR: 78 mL/min/{1.73_m2} (ref >59)

## 2022-08-26 LAB — IRON,TIBC AND FERRITIN PANEL
Ferritin: 11 ng/mL — ABNORMAL LOW (ref 15–150)
Iron Saturation: 7 % — CL (ref 15–55)
Iron: 29 ug/dL (ref 27–159)
Total Iron Binding Capacity: 398 ug/dL (ref 250–450)
UIBC: 369 ug/dL (ref 131–425)

## 2022-08-26 LAB — HEMOGLOBIN A1C
Est. average glucose Bld gHb Est-mCnc: 105 mg/dL
Hgb A1c MFr Bld: 5.3 % (ref 4.8–5.6)

## 2022-08-26 LAB — VITAMIN D 25 HYDROXY (VIT D DEFICIENCY, FRACTURES): Vit D, 25-Hydroxy: 33.5 ng/mL (ref 30.0–100.0)

## 2022-08-28 ENCOUNTER — Encounter (HOSPITAL_BASED_OUTPATIENT_CLINIC_OR_DEPARTMENT_OTHER): Payer: Self-pay | Admitting: Nurse Practitioner

## 2022-08-28 ENCOUNTER — Encounter (HOSPITAL_BASED_OUTPATIENT_CLINIC_OR_DEPARTMENT_OTHER): Payer: Self-pay

## 2022-08-28 ENCOUNTER — Encounter (HOSPITAL_BASED_OUTPATIENT_CLINIC_OR_DEPARTMENT_OTHER): Payer: Medicare Other | Admitting: Family Medicine

## 2022-08-30 ENCOUNTER — Other Ambulatory Visit (HOSPITAL_BASED_OUTPATIENT_CLINIC_OR_DEPARTMENT_OTHER): Payer: Self-pay | Admitting: Nurse Practitioner

## 2022-08-30 ENCOUNTER — Encounter (HOSPITAL_BASED_OUTPATIENT_CLINIC_OR_DEPARTMENT_OTHER): Payer: Self-pay | Admitting: Nurse Practitioner

## 2022-08-30 DIAGNOSIS — E782 Mixed hyperlipidemia: Secondary | ICD-10-CM

## 2022-08-30 DIAGNOSIS — F32A Depression, unspecified: Secondary | ICD-10-CM | POA: Insufficient documentation

## 2022-08-30 MED ORDER — FERROUS SULFATE 325 (65 FE) MG PO TBEC: DELAYED_RELEASE_TABLET | ORAL | 3 refills | Status: DC

## 2022-08-30 MED ORDER — DOCUSATE SODIUM 100 MG PO CAPS: ORAL_CAPSULE | ORAL | 5 refills | Status: AC

## 2022-09-01 MED ORDER — ROSUVASTATIN CALCIUM 10 MG PO TABS: 10.0000 mg | ORAL_TABLET | Freq: Every day | ORAL | 3 refills | Status: DC

## 2022-09-05 ENCOUNTER — Ambulatory Visit (INDEPENDENT_AMBULATORY_CARE_PROVIDER_SITE_OTHER): Payer: Medicare Other | Admitting: Family Medicine

## 2022-09-05 ENCOUNTER — Encounter (HOSPITAL_BASED_OUTPATIENT_CLINIC_OR_DEPARTMENT_OTHER): Payer: Self-pay | Admitting: Family Medicine

## 2022-09-05 DIAGNOSIS — M222X2 Patellofemoral disorders, left knee: Secondary | ICD-10-CM | POA: Diagnosis not present

## 2022-09-05 DIAGNOSIS — M222X1 Patellofemoral disorders, right knee: Secondary | ICD-10-CM | POA: Diagnosis not present

## 2022-09-05 DIAGNOSIS — M222X9 Patellofemoral disorders, unspecified knee: Secondary | ICD-10-CM | POA: Insufficient documentation
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
## 2022-09-05 NOTE — Patient Instructions (Signed)

## 2022-09-10 ENCOUNTER — Encounter (HOSPITAL_BASED_OUTPATIENT_CLINIC_OR_DEPARTMENT_OTHER): Payer: Self-pay | Admitting: Family Medicine

## 2022-09-12 DIAGNOSIS — Z0000 Encounter for general adult medical examination without abnormal findings: Secondary | ICD-10-CM | POA: Insufficient documentation

## 2022-09-12 DIAGNOSIS — N951 Menopausal and female climacteric states: Secondary | ICD-10-CM | POA: Insufficient documentation

## 2022-09-12 DIAGNOSIS — S29019A Strain of muscle and tendon of unspecified wall of thorax, initial encounter: Secondary | ICD-10-CM | POA: Insufficient documentation

## 2022-09-12 DIAGNOSIS — R030 Elevated blood-pressure reading, without diagnosis of hypertension: Secondary | ICD-10-CM | POA: Insufficient documentation
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
## 2022-09-12 NOTE — Assessment & Plan Note (Signed)
Review of current and past medical history, social history, medication, and family history.  Review of care gaps and health maintenance recommendations.  Records from recent providers to be requested if not available in Chart Review or Care Everywhere.  Recommendations for health maintenance, diet, and exercise provided.   

## 2022-09-12 NOTE — Assessment & Plan Note (Signed)
Blood pressure significantly elevated in the office today.  Patient does endorse increased anxiety and she is in pain as well.  Given her symptoms of hot flashes and her elevation in blood pressure I do feel that clonidine would be an appropriate treatment to see if we can get improvement for both symptoms.  We will send this to the pharmacy and patient will plan to follow-up in 3 months or sooner if needed.  Recommend monitoring blood pressure at home closely with goal blood pressure less than 130/85.  No alarm symptoms present at this time.

## 2022-09-12 NOTE — Assessment & Plan Note (Signed)
Symptoms of hot flashes and sweating at 46 years of age.  Concern present for perimenopause.  Will obtain labs for evaluation today.

## 2022-09-22 ENCOUNTER — Telehealth (HOSPITAL_BASED_OUTPATIENT_CLINIC_OR_DEPARTMENT_OTHER): Payer: Medicare Other | Admitting: Nurse Practitioner

## 2022-11-19 ENCOUNTER — Other Ambulatory Visit (HOSPITAL_BASED_OUTPATIENT_CLINIC_OR_DEPARTMENT_OTHER): Payer: Self-pay | Admitting: Nurse Practitioner

## 2022-11-19 DIAGNOSIS — S29019A Strain of muscle and tendon of unspecified wall of thorax, initial encounter: Secondary | ICD-10-CM

## 2022-11-19 DIAGNOSIS — Z0000 Encounter for general adult medical examination without abnormal findings: Secondary | ICD-10-CM

## 2022-11-20 ENCOUNTER — Telehealth: Payer: Self-pay | Admitting: Nurse Practitioner

## 2022-11-20 IMAGING — US US BREAST*R* LIMITED INC AXILLA
1 series · 8 of 8 positions shown · non-contrast
Comparison: Previous exam(s).

CLINICAL DATA: 45-year-old female for further evaluation of
bilateral breast masses identified on baseline screening mammogram.



[Series 1: us breast*right* limited inc axilla · 0.05mm/px · 8 of 8 slices shown]
[im 1/8]
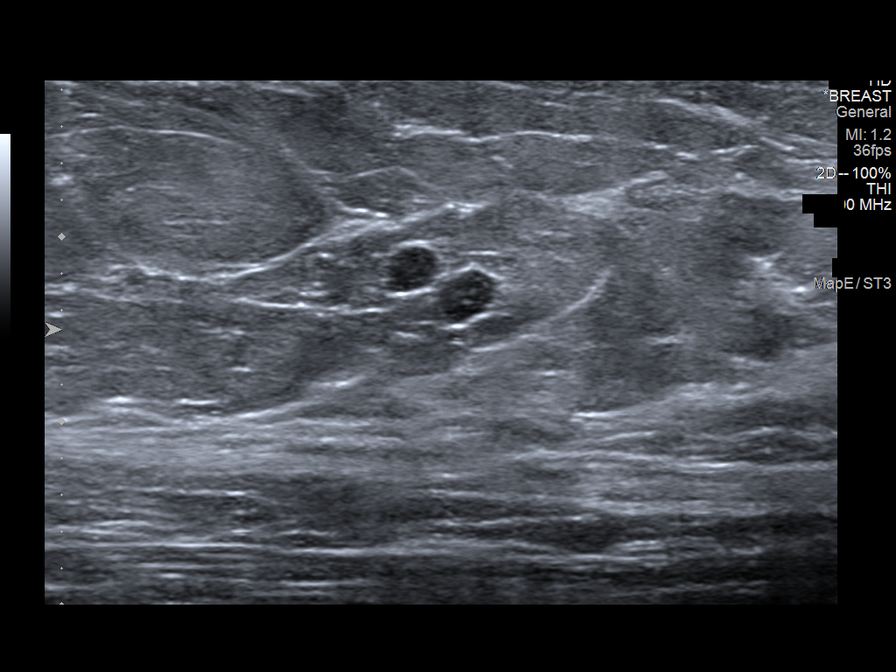
[im 2/8]
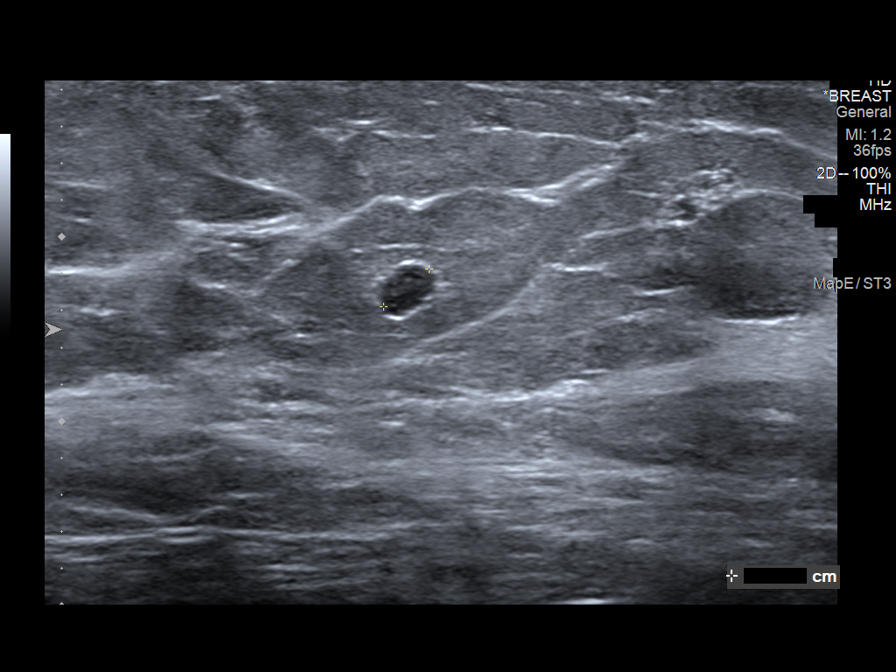
[im 3/8]
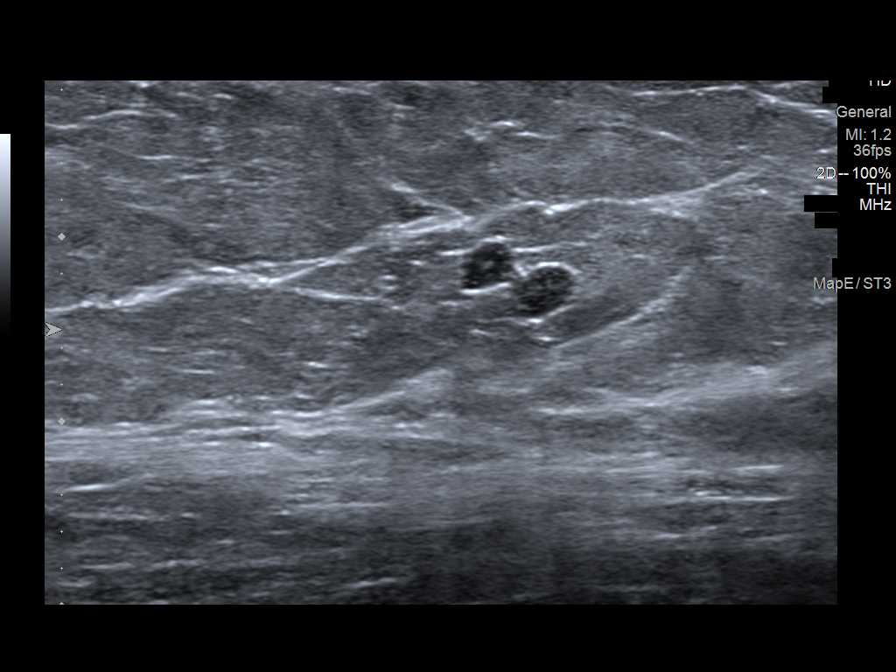
[im 4/8]
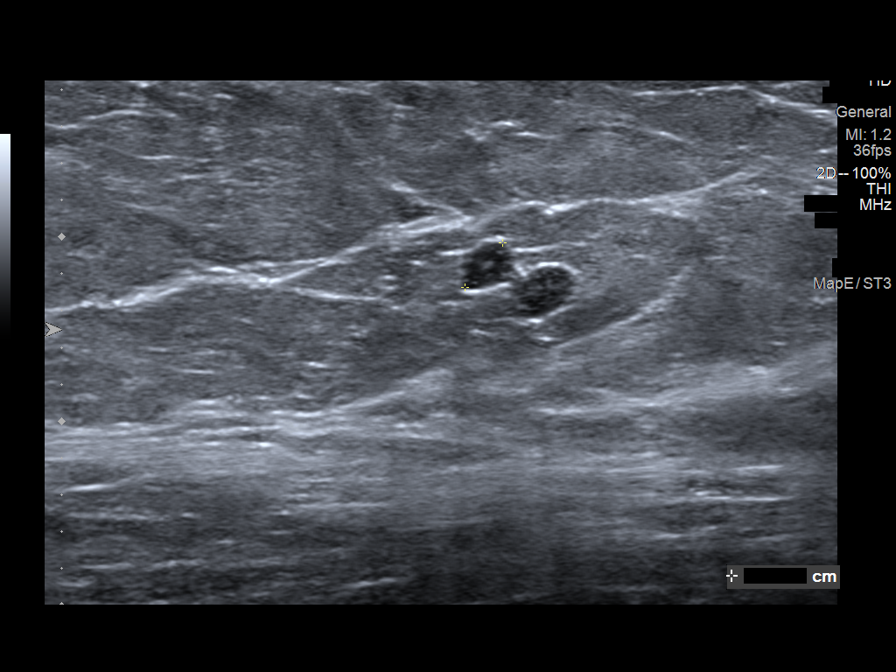
[im 5/8]
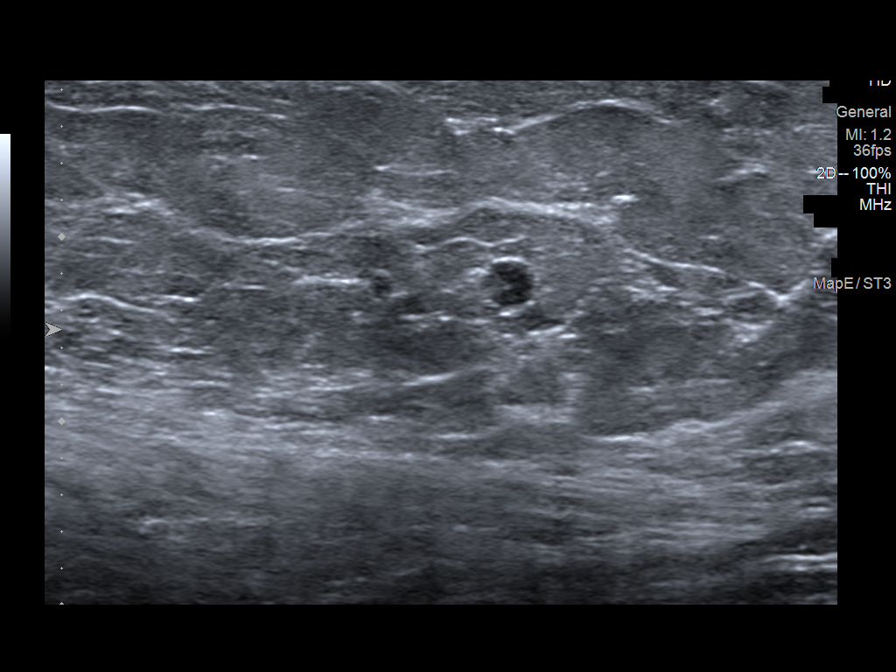
[im 6/8]
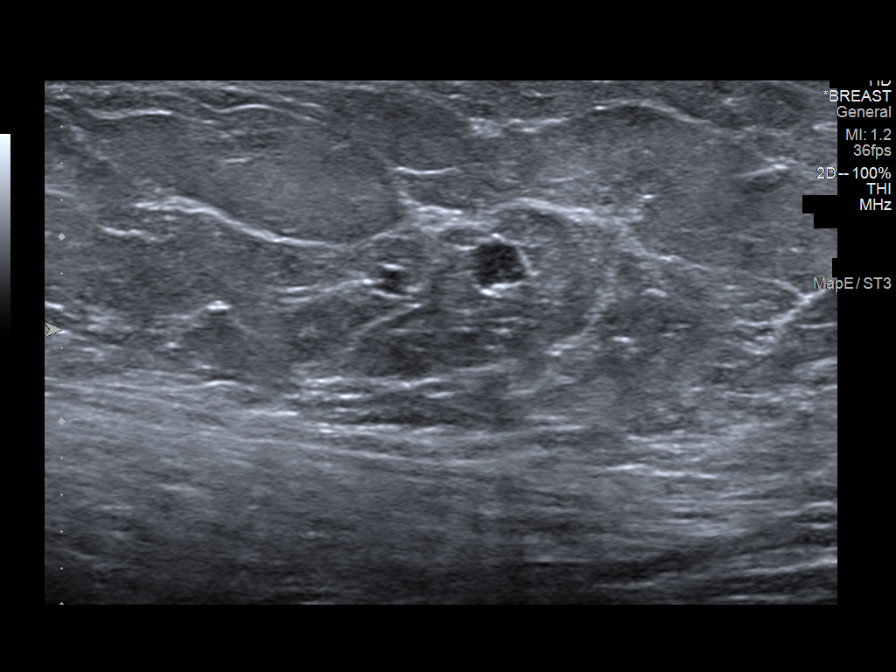
[im 7/8]
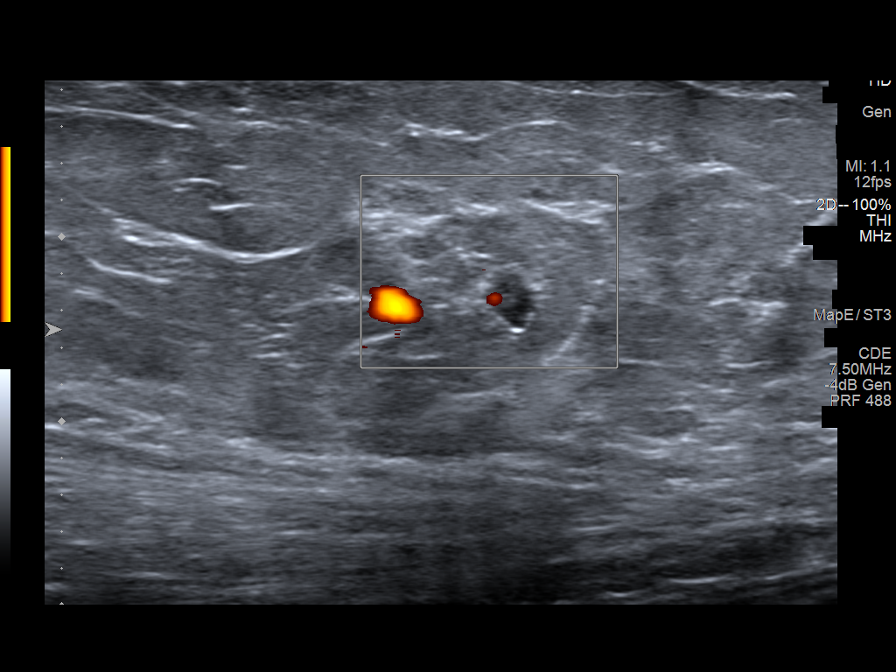
[im 8/8]
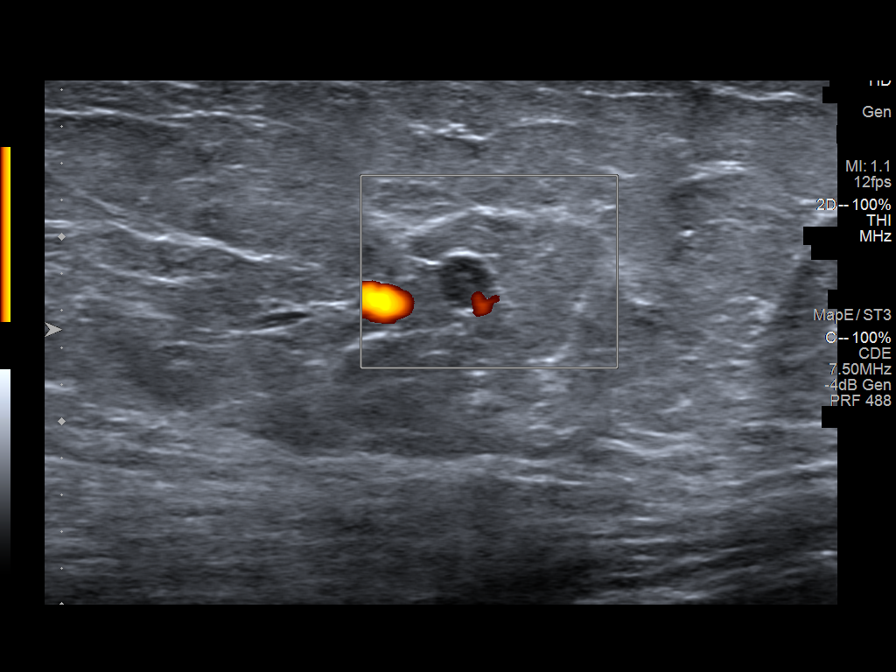

[8 of 8 positions shown; findings below may reference images not displayed]

ACR Breast Density Category b: There are scattered areas of
fibroglandular density.
FINDINGS: Spot compression views of both breast demonstrate 2 persistent
adjacent circumscribed masses within the UPPER-OUTER RIGHT breast
and a persistent circumscribed oval mass within the posterior OUTER
LEFT breast.

Targeted ultrasound is performed, showing 2 adjacent 0.3 cm benign
intraparenchymal lymph nodes at the 10 o'clock position of the RIGHT
breast 10 cm from the nipple.

A 0.6 x 0.5 x 0.8 cm benign cyst at the [DATE] position of the LEFT
breast 8 cm from the nipple is noted.

These sonographic abnormalities correspond to the screening study
findings.
IMPRESSION: 1. Benign UPPER-OUTER RIGHT breast intraparenchymal lymph nodes and
benign OUTER LEFT breast cyst, corresponding to the screening study
findings.

RECOMMENDATION:
Bilateral screening mammogram in 1 year.

I have discussed the findings and recommendations with the patient.
If applicable, a reminder letter will be sent to the patient
regarding the next appointment.

BI-RADS CATEGORY  2: Benign.

## 2022-11-20 IMAGING — US US BREAST*L* LIMITED INC AXILLA
1 series · 8 of 8 positions shown · non-contrast
Comparison: Previous exam(s).

CLINICAL DATA: 45-year-old female for further evaluation of
bilateral breast masses identified on baseline screening mammogram.



[Series 1: us breast*left* limited inc axilla · 0.06mm/px · 8 of 8 slices shown]
[im 1/8]
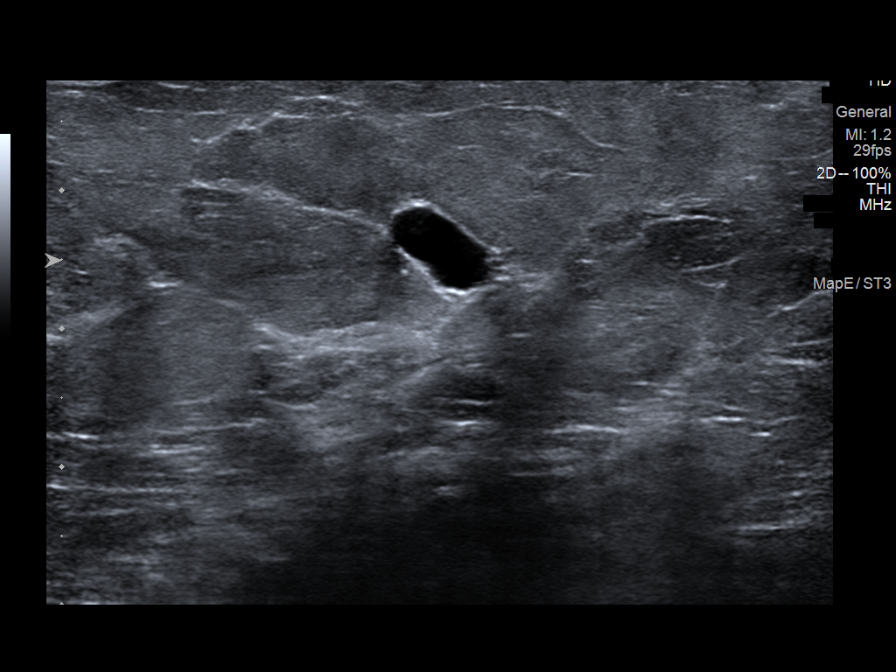
[im 2/8]
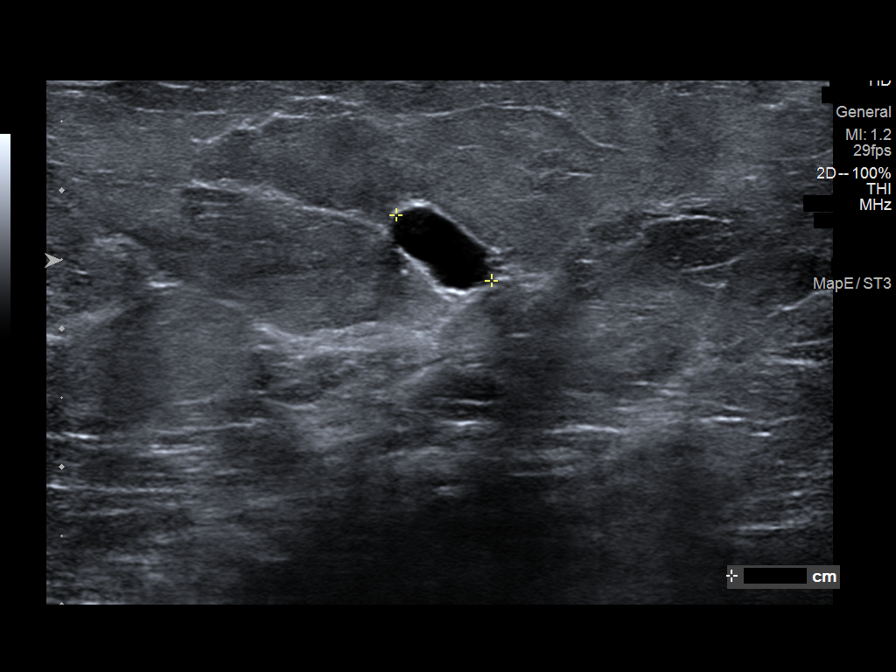
[im 3/8]
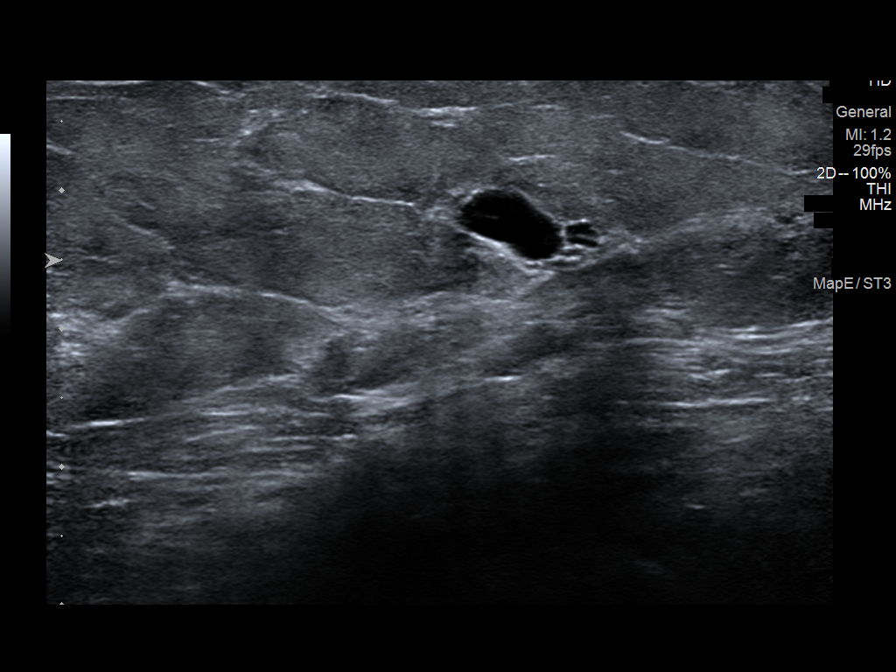
[im 4/8]
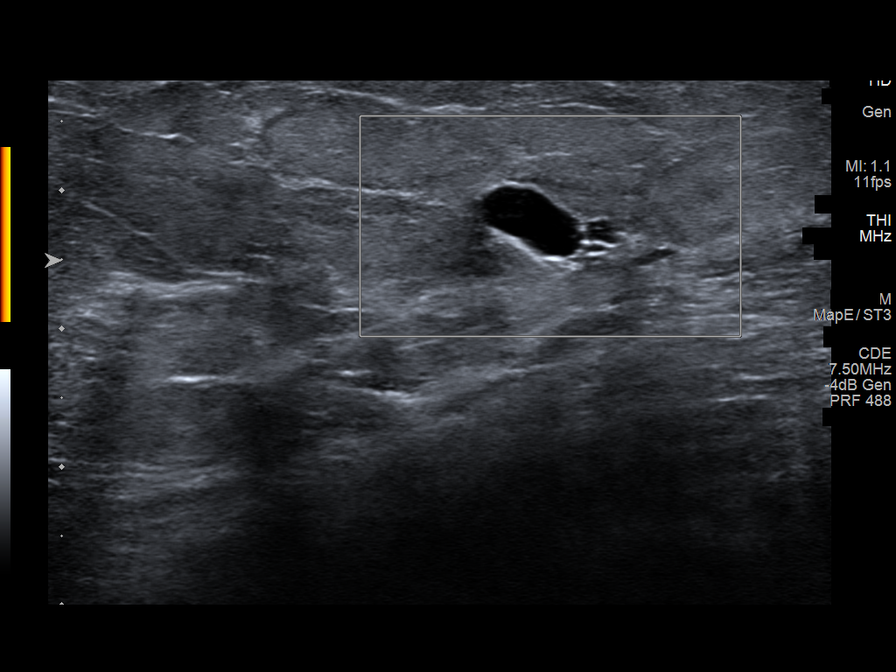
[im 5/8]
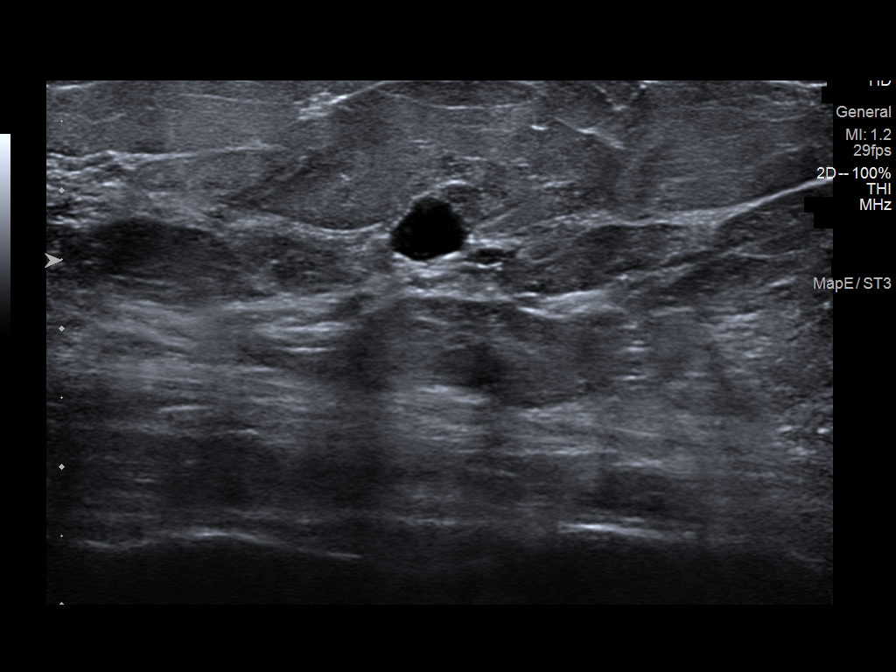
[im 6/8]
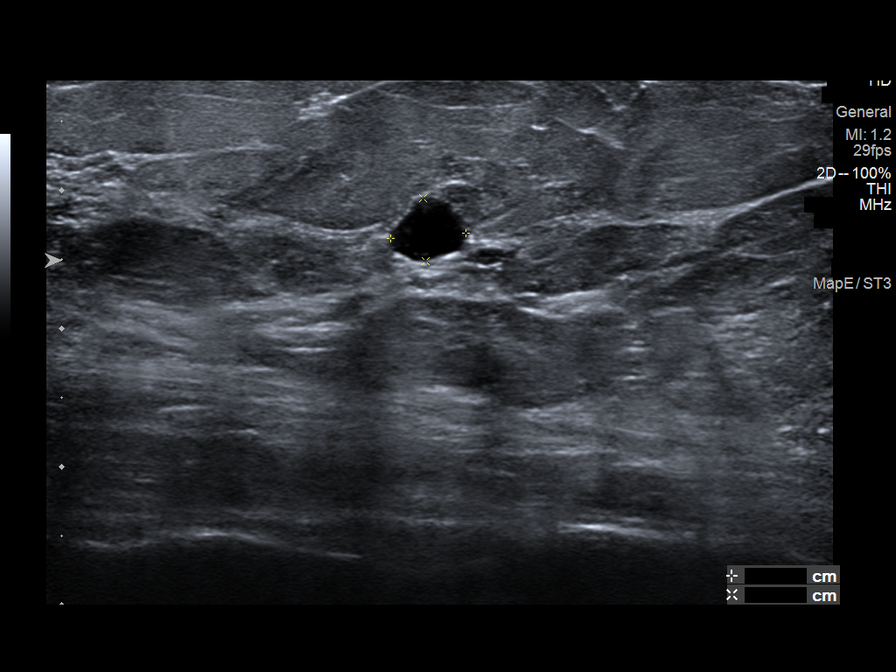
[im 7/8]
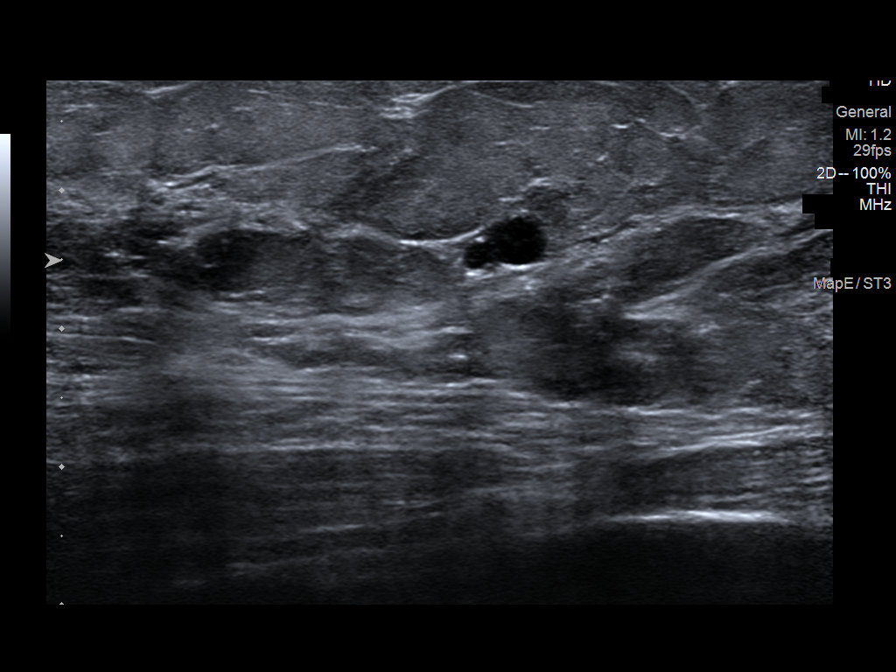
[im 8/8]
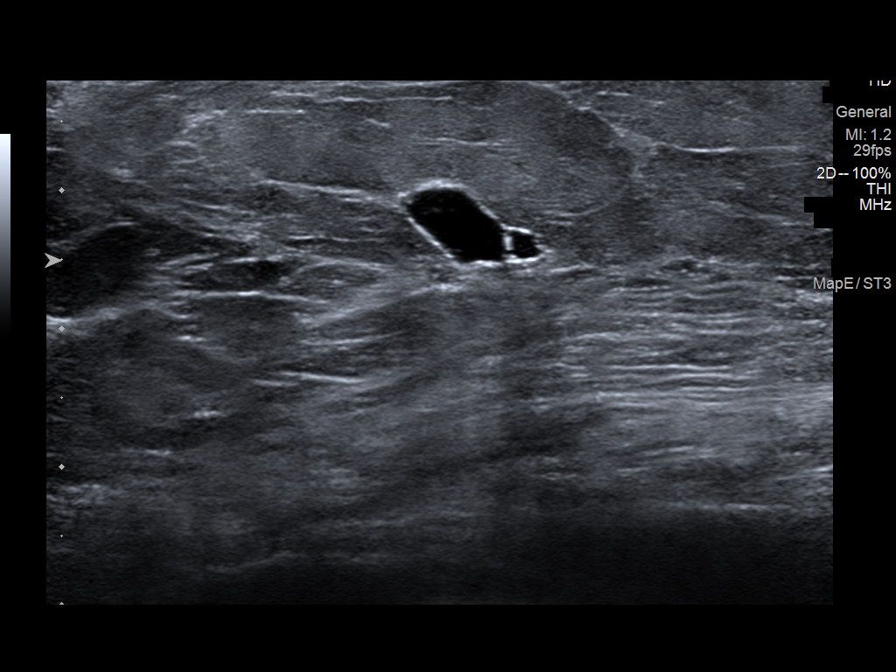

[8 of 8 positions shown; findings below may reference images not displayed]

ACR Breast Density Category b: There are scattered areas of
fibroglandular density.
FINDINGS: Spot compression views of both breast demonstrate 2 persistent
adjacent circumscribed masses within the UPPER-OUTER RIGHT breast
and a persistent circumscribed oval mass within the posterior OUTER
LEFT breast.

Targeted ultrasound is performed, showing 2 adjacent 0.3 cm benign
intraparenchymal lymph nodes at the 10 o'clock position of the RIGHT
breast 10 cm from the nipple.

A 0.6 x 0.5 x 0.8 cm benign cyst at the [DATE] position of the LEFT
breast 8 cm from the nipple is noted.

These sonographic abnormalities correspond to the screening study
findings.
IMPRESSION: 1. Benign UPPER-OUTER RIGHT breast intraparenchymal lymph nodes and
benign OUTER LEFT breast cyst, corresponding to the screening study
findings.

RECOMMENDATION:
Bilateral screening mammogram in 1 year.

I have discussed the findings and recommendations with the patient.
If applicable, a reminder letter will be sent to the patient
regarding the next appointment.

BI-RADS CATEGORY  2: Benign.
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
## 2022-11-20 NOTE — Telephone Encounter (Signed)
Call and left a message. Pt needs to schedule a follow up appt with sarabeth.

## 2022-11-20 NOTE — Telephone Encounter (Signed)
Refill request last filled 08/25/22 last apt 08/25/22.

## 2022-11-22 ENCOUNTER — Encounter: Payer: Self-pay | Admitting: Internal Medicine

## 2022-12-05 ENCOUNTER — Other Ambulatory Visit (HOSPITAL_BASED_OUTPATIENT_CLINIC_OR_DEPARTMENT_OTHER): Payer: Self-pay | Admitting: Nurse Practitioner

## 2022-12-05 DIAGNOSIS — D509 Iron deficiency anemia, unspecified: Secondary | ICD-10-CM

## 2023-01-29 ENCOUNTER — Other Ambulatory Visit (HOSPITAL_BASED_OUTPATIENT_CLINIC_OR_DEPARTMENT_OTHER): Payer: Self-pay | Admitting: Nurse Practitioner

## 2023-01-29 DIAGNOSIS — S29019A Strain of muscle and tendon of unspecified wall of thorax, initial encounter: Secondary | ICD-10-CM

## 2023-01-29 DIAGNOSIS — Z0000 Encounter for general adult medical examination without abnormal findings: Secondary | ICD-10-CM

## 2023-01-29 NOTE — Telephone Encounter (Signed)
Refill request last apt 08/25/22

## 2023-02-08 ENCOUNTER — Telehealth: Payer: Self-pay | Admitting: Nurse Practitioner

## 2023-02-08 NOTE — Telephone Encounter (Signed)
Called patient to schedule Medicare Annual Wellness Visit (AWV). Left message for patient to call back and schedule Medicare Annual Wellness Visit (AWV).  Last date of AWV: due awvi 04/13/20 per [facility]   Please schedule an appointment at any time with [facility].  If any questions, please contact me at [phone].  Thank you ,  Barkley Boards AWV direct phone # [phone]

## 2023-02-12 NOTE — Telephone Encounter (Signed)
Called patient to schedule Medicare Annual Wellness Visit (AWV). Left message for patient to call back and schedule Medicare Annual Wellness Visit (AWV).  Last date of AWV: awvi 04/13/20 per [facility]   Please schedule an appointment at any time with [facility].  If any questions, please contact me at [phone].  Thank you ,  Barkley Boards AWV direct phone # [phone]   Returned patients call   patient left message 3/29

## 2023-03-02 ENCOUNTER — Other Ambulatory Visit (HOSPITAL_BASED_OUTPATIENT_CLINIC_OR_DEPARTMENT_OTHER): Payer: Self-pay | Admitting: Nurse Practitioner

## 2023-03-02 DIAGNOSIS — D509 Iron deficiency anemia, unspecified: Secondary | ICD-10-CM

## 2023-03-05 ENCOUNTER — Other Ambulatory Visit: Payer: Self-pay

## 2023-03-05 DIAGNOSIS — D509 Iron deficiency anemia, unspecified: Secondary | ICD-10-CM

## 2023-03-05 MED ORDER — FERROUS SULFATE 325 (65 FE) MG PO TABS: ORAL_TABLET | ORAL | 0 refills | Status: AC

## 2023-03-05 NOTE — Telephone Encounter (Signed)
Called pharmacy & ins not paying for iron (ferrous sulfate)  most ins do not cover this.  Asked them to use discount card & brought cost down to $12.21, called pt and left message

## 2023-09-14 ENCOUNTER — Other Ambulatory Visit (HOSPITAL_BASED_OUTPATIENT_CLINIC_OR_DEPARTMENT_OTHER): Payer: Self-pay | Admitting: Nurse Practitioner

## 2023-09-14 DIAGNOSIS — E782 Mixed hyperlipidemia: Secondary | ICD-10-CM

## 2023-12-10 ENCOUNTER — Encounter: Payer: Medicare Other | Admitting: Nurse Practitioner

## 2024-01-25 ENCOUNTER — Ambulatory Visit: Payer: Medicare Other | Admitting: Nurse Practitioner

## 2024-02-04 ENCOUNTER — Ambulatory Visit: Payer: Medicare Other | Admitting: Nurse Practitioner

## 2024-03-25 ENCOUNTER — Ambulatory Visit: Payer: Medicare Other

## 2024-06-09 ENCOUNTER — Ambulatory Visit: Admitting: Nurse Practitioner

## 2024-08-08 ENCOUNTER — Ambulatory Visit: Admitting: Nurse Practitioner
